# Patient Record
Sex: Male | Born: 1940 | Race: White | Hispanic: No | Marital: Married | Smoking: Former smoker
Health system: Southern US, Community
[De-identification: ages and names within clinical notes are randomized; demographics above are authoritative.]

## PROBLEM LIST (undated history)

## (undated) DIAGNOSIS — E785 Hyperlipidemia, unspecified: Secondary | ICD-10-CM

## (undated) DIAGNOSIS — I2510 Atherosclerotic heart disease of native coronary artery without angina pectoris: Secondary | ICD-10-CM

## (undated) DIAGNOSIS — N289 Disorder of kidney and ureter, unspecified: Secondary | ICD-10-CM

## (undated) DIAGNOSIS — M1990 Unspecified osteoarthritis, unspecified site: Secondary | ICD-10-CM

## (undated) DIAGNOSIS — I10 Essential (primary) hypertension: Secondary | ICD-10-CM

## (undated) DIAGNOSIS — D649 Anemia, unspecified: Secondary | ICD-10-CM

## (undated) DIAGNOSIS — G4730 Sleep apnea, unspecified: Secondary | ICD-10-CM

## (undated) DIAGNOSIS — K219 Gastro-esophageal reflux disease without esophagitis: Secondary | ICD-10-CM

## (undated) HISTORY — PX: OTHER SURGICAL HISTORY: SHX169

## (undated) HISTORY — PX: HEMORRHOID SURGERY: SHX153

---

## 2008-01-11 ENCOUNTER — Ambulatory Visit: Payer: Self-pay | Admitting: Internal Medicine

## 2010-04-23 HISTORY — PX: EYE SURGERY: SHX253

## 2010-06-01 HISTORY — PX: CARDIOVASCULAR STRESS TEST: SHX262

## 2011-03-08 ENCOUNTER — Emergency Department (HOSPITAL_COMMUNITY)
Admission: EM | Admit: 2011-03-08 | Discharge: 2011-03-09 | Disposition: A | Discharge status: Home / Self Care | Payer: Medicare Other | Attending: Emergency Medicine | Admitting: Emergency Medicine

## 2011-03-08 ENCOUNTER — Encounter (HOSPITAL_COMMUNITY): Payer: Self-pay | Admitting: *Deleted

## 2011-03-08 ENCOUNTER — Emergency Department (HOSPITAL_COMMUNITY): Payer: Medicare Other

## 2011-03-08 ENCOUNTER — Other Ambulatory Visit: Payer: Self-pay

## 2011-03-08 DIAGNOSIS — I2510 Atherosclerotic heart disease of native coronary artery without angina pectoris: Secondary | ICD-10-CM | POA: Insufficient documentation

## 2011-03-08 DIAGNOSIS — T781XXA Other adverse food reactions, not elsewhere classified, initial encounter: Secondary | ICD-10-CM | POA: Insufficient documentation

## 2011-03-08 DIAGNOSIS — R21 Rash and other nonspecific skin eruption: Secondary | ICD-10-CM | POA: Insufficient documentation

## 2011-03-08 DIAGNOSIS — T7840XA Allergy, unspecified, initial encounter: Secondary | ICD-10-CM

## 2011-03-08 DIAGNOSIS — R0789 Other chest pain: Secondary | ICD-10-CM | POA: Insufficient documentation

## 2011-03-08 DIAGNOSIS — L509 Urticaria, unspecified: Secondary | ICD-10-CM | POA: Insufficient documentation

## 2011-03-08 DIAGNOSIS — E119 Type 2 diabetes mellitus without complications: Secondary | ICD-10-CM | POA: Insufficient documentation

## 2011-03-08 DIAGNOSIS — L299 Pruritus, unspecified: Secondary | ICD-10-CM | POA: Insufficient documentation

## 2011-03-08 HISTORY — DX: Atherosclerotic heart disease of native coronary artery without angina pectoris: I25.10

## 2011-03-08 MED ORDER — DIPHENHYDRAMINE HCL 12.5 MG/5ML PO ELIX
12.5000 mg | ORAL_SOLUTION | Freq: Once | ORAL | Status: AC
  Administered 2011-03-08: 12.5 mg via ORAL
  Filled 2011-03-08: qty 5

## 2011-03-08 MED ORDER — FEXOFENADINE HCL 180 MG PO TABS: 180.0000 mg | ORAL_TABLET | Freq: Every day | ORAL | Status: DC

## 2011-03-08 MED ORDER — METHYLPREDNISOLONE SODIUM SUCC 125 MG IJ SOLR
125.0000 mg | Freq: Once | INTRAMUSCULAR | Status: AC
  Administered 2011-03-08: 125 mg via INTRAVENOUS
  Filled 2011-03-08: qty 2

## 2011-03-08 MED ORDER — DIPHENHYDRAMINE HCL 50 MG/ML IJ SOLN
12.5000 mg | Freq: Once | INTRAMUSCULAR | Status: AC
  Administered 2011-03-08: 12.5 mg via INTRAVENOUS
  Filled 2011-03-08: qty 1

## 2011-03-08 MED ORDER — FAMOTIDINE IN NACL 20-0.9 MG/50ML-% IV SOLN
20.0000 mg | Freq: Once | INTRAVENOUS | Status: AC
  Administered 2011-03-08: 20 mg via INTRAVENOUS
  Filled 2011-03-08: qty 50

## 2011-03-08 MED ORDER — PREDNISONE 10 MG PO TABS: ORAL_TABLET | ORAL | Status: DC

## 2011-03-08 NOTE — ED Notes (Signed)
Allergic reaction, rash on groin area and legs, ate shrimp tonight for dinner

## 2011-03-08 NOTE — ED Notes (Signed)
Pt ate shrimp at about 7pm and started itching at about 7:30pm. Rash started a few min after. Rash extends from neck down to ankles; worse area in groin region. No respiratory distress noted.

## 2011-03-08 NOTE — ED Provider Notes (Signed)
History     CSN: [id number]  Arrival date & time 03/08/11  2107   First MD Initiated Contact with Patient 03/08/11 2230      Chief Complaint  Patient presents with  . Allergic Reaction    (Consider location/radiation/quality/duration/timing/severity/associated sxs/prior treatment) HPI Comments: Patient states he and his wife had dinner this evening, he states that he had shrimp to eat. Approximately 5-10 minutes after finishing the medial, the patient began to have itching first at the lower abdomen, and then" all over". He went to the next room where his wife was, and she noted that he had" hives" are all away around his abdomen. And the spread to the arms chest back groin and legs. The patient developed some mild tightness in the chest. The patient has a history of coronary artery disease, As well as diabetes. Patient's wife then had the son to bring the patient to the emergency department for an evaluation. The patient denies shortness of breath. Denies sweats. Denies vomiting. The tightness in the chest resolved after the patient received IV antihistamines in the emergency department.  The patient has no known allergies, and in particular no known allergies to shellfish of any kind.  Patient is a 71 y.o. male presenting with allergic reaction. The history is provided by the patient and the spouse.  Allergic Reaction The primary symptoms are  rash. The primary symptoms do not include wheezing, shortness of breath, cough, abdominal pain, dizziness or palpitations.    Past Medical History  Diagnosis Date  . Coronary artery disease   . Diabetes mellitus     Past Surgical History  Procedure Date  . Hemorrhoid surgery   . Eye surgery     No family history on file.  History  Substance Use Topics  . Smoking status: Never Smoker   . Smokeless tobacco: Not on file  . Alcohol Use: Yes      Review of Systems  Constitutional: Negative for activity change.       All ROS Neg  except as noted in HPI  HENT: Negative for nosebleeds, facial swelling and neck pain.   Eyes: Negative for photophobia and discharge.  Respiratory: Positive for chest tightness. Negative for cough, shortness of breath and wheezing.   Cardiovascular: Negative for chest pain and palpitations.       Chest tightness  Gastrointestinal: Negative for abdominal pain and blood in stool.  Genitourinary: Negative for dysuria, frequency and hematuria.  Musculoskeletal: Negative for back pain and arthralgias.  Skin: Positive for rash.       Itching  Neurological: Negative for dizziness, seizures and speech difficulty.  Psychiatric/Behavioral: Negative for hallucinations and confusion.    Allergies  Tetracyclines & related  Home Medications  No current outpatient prescriptions on file.  BP 124/71  Pulse 82  Temp 97.4 F (36.3 C)  Resp 20  Ht 5\' 7"  (1.702 m)  Wt 215 lb (97.523 kg)  BMI 33.67 kg/m2  SpO2 98%  Physical Exam  Nursing note and vitals reviewed. Constitutional: He is oriented to person, place, and time. He appears well-developed and well-nourished.  Non-toxic appearance. He appears distressed.  HENT:  Head: Normocephalic.  Right Ear: Tympanic membrane and external ear normal.  Left Ear: Tympanic membrane and external ear normal.  Mouth/Throat: Oropharynx is clear and moist.  Eyes: EOM and lids are normal. Pupils are equal, round, and reactive to light.  Neck: Normal range of motion. Neck supple. Carotid bruit is not present.  Cardiovascular: Normal rate, regular  rhythm, normal heart sounds, intact distal pulses and normal pulses.  Exam reveals no gallop and no friction rub.   Pulmonary/Chest: Breath sounds normal. No stridor. No respiratory distress. He has no wheezes. He has no rales.  Abdominal: Soft. Bowel sounds are normal. There is no tenderness. There is no guarding.  Musculoskeletal: Normal range of motion.  Lymphadenopathy:       Head (right side): No submandibular  adenopathy present.       Head (left side): No submandibular adenopathy present.    He has no cervical adenopathy.  Neurological: He is alert and oriented to person, place, and time. He has normal strength. No cranial nerve deficit or sensory deficit.  Skin: Skin is warm and dry. Rash noted. There is erythema.       Hives of various stages noted of the chest, mostly of the lower abdomen and groin area, arms, back,.  Psychiatric: He has a normal mood and affect. His speech is normal.    ED Course  Procedures (including critical care time)  Labs Reviewed - No data to display No results found. EKG: 23:03. Rate 83. Rhythm normal sinus. Axis: Left axis deviation . P wave normal. P R. interval normal. Nonspecific T-wave changes. No STEMI. No life-threatening arrhythmias.  Dx: Allergic reaction   MDM  I have reviewed nursing notes, vital signs, and all appropriate lab and imaging results for this patient. 10:28 PM the hives are beginning to improve . The itching is much improved. The patient speaks in complete sentences without problem. There is no further chest pain or tightness. 11:30 PM the hives have almost completely resolved. There is no shortness of breath. The patient continues to speak in complete sentences without problem. There is no further chest tightness. The test results have been reviewed with the patient. The plan for the patient is to have prednisone tablets and Benadryl on hand at all times, and should he run across an allergen he is to use these and then come to the emergency department immediately. We will not issue a EpiPen due to the patient's history of coronary artery disease at this time  No results found for this or any previous visit. Dg Chest 1 View  03/08/2011  *RADIOLOGY REPORT*  Clinical Data: Allergic reaction  CHEST - 1 VIEW  Comparison: None.  Findings: Decreased lung volumes with minimal basilar atelectasis. Normal heart size and vascularity.  Negative for  edema, pneumonia, collapse, consolidation, effusion or pneumothorax.  Trachea midline.  Degenerative changes of the spine.  IMPRESSION: Low volume exam with atelectasis.  Original Report Authenticated By: Judie Petit. Ruel Favors, M.D.       Kathie Dike, [place] 03/08/11 2340

## 2011-03-09 NOTE — ED Provider Notes (Signed)
Medical screening examination/treatment/procedure(s) were conducted as a shared visit with non-physician practitioner(s) and myself.  I personally evaluated the patient during the encounter.  Allergic reaction improved with IV Solu-Medrol, Pepcid, Benadryl.  Patient stable at discharge  Donnetta Hutching, MD 03/09/11 0009

## 2011-04-23 HISTORY — PX: OTHER SURGICAL HISTORY: SHX169

## 2011-06-21 ENCOUNTER — Encounter (HOSPITAL_COMMUNITY): Payer: Self-pay | Admitting: Pharmacy Technician

## 2011-06-23 ENCOUNTER — Encounter (HOSPITAL_COMMUNITY)
Admission: RE | Admit: 2011-06-23 | Discharge: 2011-06-23 | Disposition: A | Discharge status: Home / Self Care | Payer: Medicare Other | Source: Ambulatory Visit | Attending: Orthopedic Surgery | Admitting: Orthopedic Surgery

## 2011-06-23 ENCOUNTER — Encounter (HOSPITAL_COMMUNITY): Payer: Self-pay

## 2011-06-23 HISTORY — DX: Anemia, unspecified: D64.9

## 2011-06-23 HISTORY — DX: Unspecified osteoarthritis, unspecified site: M19.90

## 2011-06-23 HISTORY — DX: Gastro-esophageal reflux disease without esophagitis: K21.9

## 2011-06-23 HISTORY — DX: Sleep apnea, unspecified: G47.30

## 2011-06-23 HISTORY — DX: Disorder of kidney and ureter, unspecified: N28.9

## 2011-06-23 HISTORY — DX: Essential (primary) hypertension: I10

## 2011-06-23 HISTORY — DX: Hyperlipidemia, unspecified: E78.5

## 2011-06-23 LAB — SURGICAL PCR SCREEN: MRSA, PCR: INVALID — AB

## 2011-06-23 LAB — CBC
MCHC: 34.6 g/dL (ref 30.0–36.0)
Platelets: 192 10*3/uL (ref 150–400)
RDW: 12.6 % (ref 11.5–15.5)
WBC: 8.2 10*3/uL (ref 4.0–10.5)

## 2011-06-23 LAB — URINALYSIS, ROUTINE W REFLEX MICROSCOPIC
Glucose, UA: NEGATIVE mg/dL
Ketones, ur: NEGATIVE mg/dL
Leukocytes, UA: NEGATIVE
Protein, ur: NEGATIVE mg/dL
Urobilinogen, UA: 0.2 mg/dL (ref 0.0–1.0)

## 2011-06-23 LAB — BASIC METABOLIC PANEL
BUN: 29 mg/dL — ABNORMAL HIGH (ref 6–23)
Calcium: 10.2 mg/dL (ref 8.4–10.5)
Chloride: 97 mEq/L (ref 96–112)
Creatinine, Ser: 1.99 mg/dL — ABNORMAL HIGH (ref 0.50–1.35)
GFR calc Af Amer: 38 mL/min — ABNORMAL LOW (ref >90)
GFR calc non Af Amer: 33 mL/min — ABNORMAL LOW (ref >90)

## 2011-06-23 LAB — DIFFERENTIAL
Basophils Absolute: 0 10*3/uL (ref 0.0–0.1)
Basophils Relative: 1 % (ref 0–1)
Lymphocytes Relative: 24 % (ref 12–46)
Monocytes Absolute: 1.1 10*3/uL — ABNORMAL HIGH (ref 0.1–1.0)
Neutro Abs: 4.7 10*3/uL (ref 1.7–7.7)
Neutrophils Relative %: 57 % (ref 43–77)

## 2011-06-23 LAB — PROTIME-INR: Prothrombin Time: 13.1 seconds (ref 11.6–15.2)

## 2011-06-23 LAB — APTT: aPTT: 32 seconds (ref 24–37)

## 2011-06-23 MED ORDER — CEFAZOLIN SODIUM 1-5 GM-% IV SOLN: 1.0000 g | INTRAVENOUS | Status: DC

## 2011-06-23 NOTE — Pre-Procedure Instructions (Signed)
Called creatinine 1.99 and bun 29 result to courtney sykes for dr Charlann Boxer

## 2011-06-23 NOTE — Pre-Procedure Instructions (Addendum)
ekg 03-08-2011 epic Chest 1 view xray 03-08-2011 epic Medical clearance note dr Burnadette Pop on chart

## 2011-06-23 NOTE — Patient Instructions (Addendum)
William Harrington  06/23/2011   Your procedure is scheduled on:  Thursday Jul 01, 2011  Report to [facility] at 0530  AM.  Call this number if you have problems the morning of surgery: [phone]   Remember:   Do not eat food or drink liquids:After Midnight.  .  Take these medicines the morning of surgery with A SIP OF WATER: metoprolol, omeprazole   Do not wear jewelry or make up.  Do not wear lotions, powders, or perfumes.Do not wear deodorant.    Do not bring valuables to the hospital.  Contacts, dentures or bridgework may not be worn into surgery.  Leave suitcase in the car. After surgery it may be brought to your room.  For patients admitted to the hospital, checkout time is 11:00 AM the day of discharge.     Special Instructions: CHG Shower Use Special Wash: 1/2 bottle night before surgery and 1/2 bottle morning of surgery use regular soap on face and front and back private areas.   Please read over the following fact sheets that you were given: MRSA Information, blood fact sheet, incentive spirometer fact sheet  William Harrington [facility] pre op nurse phone number [phone], call if needed

## 2011-06-23 NOTE — Progress Notes (Signed)
06/23/11 1317  OBSTRUCTIVE SLEEP APNEA  Have you ever been diagnosed with sleep apnea through a sleep study? No  Do you snore loudly (loud enough to be heard through closed doors)?  1  Do you often feel tired, fatigued, or sleepy during the daytime? 0  Has anyone observed you stop breathing during your sleep? 0  Do you have, or are you being treated for high blood pressure? 1  BMI more than 35 kg/m2? 1  Age over 70 years old? 1  Neck circumference greater than 40 cm/18 inches? 0  Gender: 1  Obstructive Sleep Apnea Score 5   Score 4 or greater  Results sent to PCP;Updated health history

## 2011-06-26 LAB — MRSA CULTURE

## 2011-06-27 NOTE — H&P (Signed)
William Harrington is an 71 y.o. male.    Chief Complaint: left hip OA and pain   HPI: Pt is a 71 y.o. male complaining of left hip pain for 2+ years. Pain had continually increased since the beginning, denies any incident or trauma. X-rays in the clinic show end-stage arthritic changes of the left hip. Pt has tried various conservative treatments which have failed to alleviate their symptoms. Various options are discussed with the patient. Risks, benefits and expectations were discussed with the patient. Patient understand the risks, benefits and expectations and wishes to proceed with surgery.   PCP:  No primary provider on file.  D/C Plans:  Home with HHPT  Post-op Meds:  Rx given for ASA, Robaxin, Iron, Colace and MiraLax  Tranexamic Acid:   Not to be given - Vascular disease  Decadron:   Not to be given - Diabetes  PMH: Past Medical History  Diagnosis Date  . Coronary artery disease   . Diabetes mellitus   . Hypertension   . Hyperlipidemia   . Anemia yrs ago  . GERD (gastroesophageal reflux disease)   . Arthritis   . Renal insufficiency     creatinine 1.5 baseline   . Sleep apnea     STOPBANG=5    PSH: Past Surgical History  Procedure Date  . Hemorrhoid surgery yrs ago  . Cardiovascular stress test 06-01-2010    normal nuclear stress test per dr Lady Gary. last office visit note dr Purcell Mouton 02-01-2011 on chart  . Eye surgery 04-2010    macular hole surgery, right eye  . Cataract extraction right eye 04-2011  . Colonscopy 5 yrs ago    Social History:  reports that he quit smoking about 16 years ago. His smoking use included Cigarettes. He has a 82 pack-year smoking history. He has never used smokeless tobacco. He reports that he drinks alcohol. He reports that he does not use illicit drugs.  Allergies:  Allergies  Allergen Reactions  . Tetracyclines & Related Nausea And Vomiting  . Shrimp (Shellfish Allergy) Rash    wheezing    Medications: No current  facility-administered medications for this encounter.   Current Outpatient Prescriptions  Medication Sig Dispense Refill  . acetaminophen (TYLENOL EX ST ARTHRITIS PAIN) 500 MG tablet Take 500 mg by mouth every 6 (six) hours as needed. For pain      . aspirin EC 81 MG tablet Take 81 mg by mouth daily with breakfast.       . DICLOFENAC POTASSIUM PO Take 1 tablet by mouth 2 (two) times daily. Patient takes 75mg  twice a day      . fish oil-omega-3 fatty acids 1000 MG capsule Take 1 g by mouth 2 (two) times daily.       Marland Kitchen glipiZIDE (GLUCOTROL) 5 MG tablet Take 2.5 mg by mouth 2 (two) times daily before a meal.      . hydrochlorothiazide (HYDRODIURIL) 25 MG tablet Take 25 mg by mouth daily with breakfast.       . lisinopril (PRINIVIL,ZESTRIL) 40 MG tablet Take 40 mg by mouth daily with breakfast.      . metoprolol (LOPRESSOR) 100 MG tablet Take 100 mg by mouth daily with breakfast.      . omeprazole (PRILOSEC) 20 MG capsule Take 20 mg by mouth daily with breakfast.       . simvastatin (ZOCOR) 80 MG tablet Take 40 mg by mouth at bedtime. Patient takes 1/2 tablet by mouth at bedtime (40mg )      .  vitamin E (VITAMIN E) 400 UNIT capsule Take 400 Units by mouth daily.        ROS: Review of Systems  Constitutional: Negative.   HENT: Negative.   Eyes: Negative.   Respiratory: Negative.   Cardiovascular: Negative.   Gastrointestinal: Negative.   Genitourinary: Negative.   Musculoskeletal: Positive for joint pain.  Skin: Negative.   Neurological: Negative.   Endo/Heme/Allergies: Negative.   Psychiatric/Behavioral: Negative.      Physical Exam: Physical Exam  Constitutional: He is oriented to person, place, and time and well-developed, well-nourished, and in no distress.  HENT:  Head: Normocephalic and atraumatic.  Nose: Nose normal.  Mouth/Throat: Oropharynx is clear and moist. He has dentures.  Eyes: Pupils are equal, round, and reactive to light.  Neck: Neck supple. No JVD present. No  tracheal deviation present. No thyromegaly present.  Cardiovascular: Normal rate, regular rhythm, normal heart sounds and intact distal pulses.   Pulmonary/Chest: Effort normal and breath sounds normal. No stridor.  Abdominal: Soft. There is no tenderness. There is no guarding.  Musculoskeletal:       Left hip: He exhibits decreased range of motion, decreased strength, tenderness, bony tenderness and crepitus. He exhibits no swelling, no deformity and no laceration.  Lymphadenopathy:    He has no cervical adenopathy.  Neurological: He is alert and oriented to person, place, and time.  Skin: Skin is warm and dry.  Psychiatric: Affect normal.     Assessment/Plan Assessment: left hip OA and pain   Plan: Patient will undergo a left total hip arthroplasty, anterior approach on 07/01/2011 per Dr. Charlann Boxer at [facility]. Risks benefits and expectation were discussed with the patient. Patient understand risks, benefits and expectation and wishes to proceed.   William Harrington   PAC  06/27/2011, 10:49 PM

## 2011-06-30 ENCOUNTER — Encounter (HOSPITAL_COMMUNITY): Payer: Self-pay | Admitting: Anesthesiology

## 2011-06-30 NOTE — Anesthesia Preprocedure Evaluation (Addendum)
Anesthesia Evaluation  Patient identified by MRN, date of birth, ID band Patient awake    Reviewed: Allergy & Precautions, H&P , NPO status , Patient's Chart, lab work & pertinent test results  Airway       Dental   Pulmonary sleep apnea , former smoker CXR: 03/08/11: Low volume exam with atelectasis.         Cardiovascular Exercise Tolerance: Good hypertension, Pt. on medications and Pt. on home beta blockers + CAD  ECG: NSR, LAD, possible anterior infarct. Normal stress test in 2012 per Dr. Lady Gary.   Neuro/Psych negative neurological ROS  negative psych ROS   GI/Hepatic Neg liver ROS, GERD-  Medicated,  Endo/Other  negative endocrine ROSDiabetes mellitus-, Type 2, Oral Hypoglycemic Agents  Renal/GU Renal InsufficiencyRenal disease  negative genitourinary   Musculoskeletal negative musculoskeletal ROS (+)   Abdominal (+) + obese,   Peds negative pediatric ROS (+)  Hematology negative hematology ROS (+)   Anesthesia Other Findings   Reproductive/Obstetrics negative OB ROS                        Anesthesia Physical Anesthesia Plan  ASA: III  Anesthesia Plan: General   Post-op Pain Management:    Induction: Intravenous  Airway Management Planned: Oral ETT  Additional Equipment:   Intra-op Plan:   Post-operative Plan: Extubation in OR  Informed Consent: I have reviewed the patients History and Physical, chart, labs and discussed the procedure including the risks, benefits and alternatives for the proposed anesthesia with the patient or authorized representative who has indicated his/her understanding and acceptance.   Dental advisory given  Plan Discussed with: CRNA  Anesthesia Plan Comments:         Anesthesia Quick Evaluation

## 2011-07-01 ENCOUNTER — Ambulatory Visit (HOSPITAL_COMMUNITY): Payer: Medicare Other

## 2011-07-01 ENCOUNTER — Encounter (HOSPITAL_COMMUNITY): Payer: Self-pay | Admitting: Anesthesiology

## 2011-07-01 ENCOUNTER — Inpatient Hospital Stay (HOSPITAL_COMMUNITY)
Admission: RE | Admit: 2011-07-01 | Discharge: 2011-07-03 | DRG: 470 | Disposition: A | Discharge status: Home Health | Payer: Medicare Other | Source: Ambulatory Visit | Attending: Orthopedic Surgery | Admitting: Orthopedic Surgery

## 2011-07-01 ENCOUNTER — Encounter (HOSPITAL_COMMUNITY): Payer: Self-pay

## 2011-07-01 ENCOUNTER — Encounter (HOSPITAL_COMMUNITY)
Admission: RE | Disposition: A | Discharge status: Home Health | Payer: Self-pay | Source: Ambulatory Visit | Attending: Orthopedic Surgery

## 2011-07-01 ENCOUNTER — Ambulatory Visit (HOSPITAL_COMMUNITY): Payer: Medicare Other | Admitting: Anesthesiology

## 2011-07-01 DIAGNOSIS — Z96649 Presence of unspecified artificial hip joint: Secondary | ICD-10-CM

## 2011-07-01 DIAGNOSIS — K219 Gastro-esophageal reflux disease without esophagitis: Secondary | ICD-10-CM | POA: Diagnosis present

## 2011-07-01 DIAGNOSIS — Z01812 Encounter for preprocedural laboratory examination: Secondary | ICD-10-CM

## 2011-07-01 DIAGNOSIS — E119 Type 2 diabetes mellitus without complications: Secondary | ICD-10-CM | POA: Diagnosis present

## 2011-07-01 DIAGNOSIS — I10 Essential (primary) hypertension: Secondary | ICD-10-CM | POA: Diagnosis present

## 2011-07-01 DIAGNOSIS — D649 Anemia, unspecified: Secondary | ICD-10-CM | POA: Diagnosis not present

## 2011-07-01 DIAGNOSIS — E785 Hyperlipidemia, unspecified: Secondary | ICD-10-CM | POA: Diagnosis present

## 2011-07-01 DIAGNOSIS — G4730 Sleep apnea, unspecified: Secondary | ICD-10-CM | POA: Diagnosis present

## 2011-07-01 DIAGNOSIS — N289 Disorder of kidney and ureter, unspecified: Secondary | ICD-10-CM | POA: Diagnosis present

## 2011-07-01 DIAGNOSIS — M1610 Unilateral primary osteoarthritis, unspecified hip: Principal | ICD-10-CM | POA: Diagnosis present

## 2011-07-01 DIAGNOSIS — M169 Osteoarthritis of hip, unspecified: Principal | ICD-10-CM | POA: Diagnosis present

## 2011-07-01 HISTORY — PX: TOTAL HIP ARTHROPLASTY: SHX124

## 2011-07-01 LAB — GLUCOSE, CAPILLARY
Glucose-Capillary: 209 mg/dL — ABNORMAL HIGH (ref 70–99)
Glucose-Capillary: 172 mg/dL — ABNORMAL HIGH (ref 70–99)
Glucose-Capillary: 204 mg/dL — ABNORMAL HIGH (ref 70–99)

## 2011-07-01 LAB — TYPE AND SCREEN
ABO/RH(D): O POS
Antibody Screen: NEGATIVE

## 2011-07-01 SURGERY — ARTHROPLASTY, HIP, TOTAL, ANTERIOR APPROACH
Anesthesia: General | Site: Hip | Laterality: Left | Wound class: Clean

## 2011-07-01 MED ORDER — BISACODYL 5 MG PO TBEC: 5.0000 mg | DELAYED_RELEASE_TABLET | Freq: Every day | ORAL | Status: DC | PRN

## 2011-07-01 MED ORDER — CEFAZOLIN SODIUM-DEXTROSE 2-3 GM-% IV SOLR
2.0000 g | Freq: Four times a day (QID) | INTRAVENOUS | Status: AC
Start: 2011-07-01 — End: 2011-07-02
  Administered 2011-07-01 – 2011-07-02 (×3): 2 g via INTRAVENOUS
  Filled 2011-07-01 (×4): qty 50

## 2011-07-01 MED ORDER — LACTATED RINGERS IV SOLN: INTRAVENOUS | Status: DC

## 2011-07-01 MED ORDER — POLYETHYLENE GLYCOL 3350 17 G PO PACK
17.0000 g | PACK | Freq: Two times a day (BID) | ORAL | Status: DC
  Administered 2011-07-01 – 2011-07-03 (×4): 17 g via ORAL

## 2011-07-01 MED ORDER — METOPROLOL TARTRATE 100 MG PO TABS
100.0000 mg | ORAL_TABLET | Freq: Every day | ORAL | Status: DC
  Administered 2011-07-02 – 2011-07-03 (×2): 100 mg via ORAL
  Filled 2011-07-01 (×3): qty 1

## 2011-07-01 MED ORDER — SUCCINYLCHOLINE CHLORIDE 20 MG/ML IJ SOLN
INTRAMUSCULAR | Status: DC | PRN
  Administered 2011-07-01: 100 mg via INTRAVENOUS

## 2011-07-01 MED ORDER — ONDANSETRON HCL 4 MG/2ML IJ SOLN
INTRAMUSCULAR | Status: DC | PRN
  Administered 2011-07-01: 4 mg via INTRAVENOUS

## 2011-07-01 MED ORDER — CEFAZOLIN SODIUM-DEXTROSE 2-3 GM-% IV SOLR
2.0000 g | Freq: Once | INTRAVENOUS | Status: AC
  Administered 2011-07-01: 2 g via INTRAVENOUS

## 2011-07-01 MED ORDER — ALUM & MAG HYDROXIDE-SIMETH 200-200-20 MG/5ML PO SUSP: 30.0000 mL | ORAL | Status: DC | PRN

## 2011-07-01 MED ORDER — SODIUM CHLORIDE 0.9 % IV SOLN
100.0000 mL/h | INTRAVENOUS | Status: DC
  Administered 2011-07-01: 100 mL/h via INTRAVENOUS
  Filled 2011-07-01 (×5): qty 1000

## 2011-07-01 MED ORDER — INSULIN ASPART 100 UNIT/ML ~~LOC~~ SOLN
0.0000 [IU] | Freq: Three times a day (TID) | SUBCUTANEOUS | Status: DC
  Administered 2011-07-01: 3 [IU] via SUBCUTANEOUS
  Administered 2011-07-01 – 2011-07-02 (×3): 5 [IU] via SUBCUTANEOUS
  Administered 2011-07-02 – 2011-07-03 (×2): 3 [IU] via SUBCUTANEOUS

## 2011-07-01 MED ORDER — INSULIN ASPART 100 UNIT/ML ~~LOC~~ SOLN: 0.0000 [IU] | Freq: Three times a day (TID) | SUBCUTANEOUS | Status: DC

## 2011-07-01 MED ORDER — PANTOPRAZOLE SODIUM 40 MG PO TBEC
40.0000 mg | DELAYED_RELEASE_TABLET | Freq: Every day | ORAL | Status: DC
  Administered 2011-07-01 – 2011-07-02 (×2): 40 mg via ORAL
  Filled 2011-07-01 (×3): qty 1

## 2011-07-01 MED ORDER — METOCLOPRAMIDE HCL 10 MG PO TABS: 5.0000 mg | ORAL_TABLET | Freq: Three times a day (TID) | ORAL | Status: DC | PRN

## 2011-07-01 MED ORDER — FERROUS SULFATE 325 (65 FE) MG PO TABS
325.0000 mg | ORAL_TABLET | Freq: Three times a day (TID) | ORAL | Status: DC
  Administered 2011-07-01 – 2011-07-03 (×6): 325 mg via ORAL
  Filled 2011-07-01 (×9): qty 1

## 2011-07-01 MED ORDER — NEOSTIGMINE METHYLSULFATE 1 MG/ML IJ SOLN
INTRAMUSCULAR | Status: DC | PRN
  Administered 2011-07-01: 4 mg via INTRAVENOUS

## 2011-07-01 MED ORDER — GLYCOPYRROLATE 0.2 MG/ML IJ SOLN
INTRAMUSCULAR | Status: DC | PRN
  Administered 2011-07-01: .5 mg via INTRAVENOUS
  Administered 2011-07-01: .2 mg via INTRAVENOUS

## 2011-07-01 MED ORDER — HYDROCODONE-ACETAMINOPHEN 7.5-325 MG PO TABS
1.0000 | ORAL_TABLET | ORAL | Status: DC
  Administered 2011-07-01 (×2): 2 via ORAL
  Administered 2011-07-01: 1 via ORAL
  Administered 2011-07-02 – 2011-07-03 (×7): 2 via ORAL
  Administered 2011-07-03 (×2): 1 via ORAL
  Administered 2011-07-03: 2 via ORAL
  Filled 2011-07-01 (×2): qty 1
  Filled 2011-07-01 (×11): qty 2

## 2011-07-01 MED ORDER — METHOCARBAMOL 100 MG/ML IJ SOLN
500.0000 mg | Freq: Four times a day (QID) | INTRAVENOUS | Status: DC | PRN
  Administered 2011-07-01: 500 mg via INTRAVENOUS
  Filled 2011-07-01 (×2): qty 5

## 2011-07-01 MED ORDER — CEFAZOLIN SODIUM-DEXTROSE 2-3 GM-% IV SOLR
INTRAVENOUS | Status: AC
  Filled 2011-07-01: qty 50

## 2011-07-01 MED ORDER — DOCUSATE SODIUM 100 MG PO CAPS
100.0000 mg | ORAL_CAPSULE | Freq: Two times a day (BID) | ORAL | Status: DC
  Administered 2011-07-01 – 2011-07-03 (×5): 100 mg via ORAL

## 2011-07-01 MED ORDER — HYDROMORPHONE HCL PF 1 MG/ML IJ SOLN
INTRAMUSCULAR | Status: AC
  Filled 2011-07-01: qty 1

## 2011-07-01 MED ORDER — GLIPIZIDE 2.5 MG HALF TABLET
2.5000 mg | ORAL_TABLET | Freq: Two times a day (BID) | ORAL | Status: DC
  Administered 2011-07-01 – 2011-07-03 (×4): 2.5 mg via ORAL
  Filled 2011-07-01 (×7): qty 1

## 2011-07-01 MED ORDER — MENTHOL 3 MG MT LOZG
1.0000 | LOZENGE | OROMUCOSAL | Status: DC | PRN
  Filled 2011-07-01: qty 9

## 2011-07-01 MED ORDER — LACTATED RINGERS IV SOLN
INTRAVENOUS | Status: DC | PRN
  Administered 2011-07-01 (×2): via INTRAVENOUS

## 2011-07-01 MED ORDER — ACETAMINOPHEN 10 MG/ML IV SOLN
INTRAVENOUS | Status: DC | PRN
  Administered 2011-07-01: 1000 mg via INTRAVENOUS

## 2011-07-01 MED ORDER — ZOLPIDEM TARTRATE 5 MG PO TABS: 5.0000 mg | ORAL_TABLET | Freq: Every evening | ORAL | Status: DC | PRN

## 2011-07-01 MED ORDER — METOCLOPRAMIDE HCL 5 MG/ML IJ SOLN: 5.0000 mg | Freq: Three times a day (TID) | INTRAMUSCULAR | Status: DC | PRN

## 2011-07-01 MED ORDER — 0.9 % SODIUM CHLORIDE (POUR BTL) OPTIME
TOPICAL | Status: DC | PRN
  Administered 2011-07-01: 1000 mL

## 2011-07-01 MED ORDER — ONDANSETRON HCL 4 MG PO TABS: 4.0000 mg | ORAL_TABLET | Freq: Four times a day (QID) | ORAL | Status: DC | PRN

## 2011-07-01 MED ORDER — DIPHENHYDRAMINE HCL 25 MG PO CAPS: 25.0000 mg | ORAL_CAPSULE | Freq: Four times a day (QID) | ORAL | Status: DC | PRN

## 2011-07-01 MED ORDER — HYDROMORPHONE HCL PF 1 MG/ML IJ SOLN
0.2500 mg | INTRAMUSCULAR | Status: DC | PRN
  Administered 2011-07-01: 0.5 mg via INTRAVENOUS
  Administered 2011-07-01 (×2): 0.25 mg via INTRAVENOUS
  Administered 2011-07-01: 0.5 mg via INTRAVENOUS

## 2011-07-01 MED ORDER — RIVAROXABAN 10 MG PO TABS
10.0000 mg | ORAL_TABLET | ORAL | Status: DC
  Administered 2011-07-02 – 2011-07-03 (×2): 10 mg via ORAL
  Filled 2011-07-01 (×3): qty 1

## 2011-07-01 MED ORDER — CISATRACURIUM BESYLATE (PF) 10 MG/5ML IV SOLN
INTRAVENOUS | Status: DC | PRN
  Administered 2011-07-01: 4 mg via INTRAVENOUS

## 2011-07-01 MED ORDER — ATORVASTATIN CALCIUM 20 MG PO TABS
20.0000 mg | ORAL_TABLET | Freq: Every day | ORAL | Status: DC
  Administered 2011-07-01 – 2011-07-02 (×2): 20 mg via ORAL
  Filled 2011-07-01 (×3): qty 1

## 2011-07-01 MED ORDER — PROMETHAZINE HCL 25 MG/ML IJ SOLN: 6.2500 mg | INTRAMUSCULAR | Status: DC | PRN

## 2011-07-01 MED ORDER — MIDAZOLAM HCL 5 MG/5ML IJ SOLN
INTRAMUSCULAR | Status: DC | PRN
  Administered 2011-07-01: 2 mg via INTRAVENOUS

## 2011-07-01 MED ORDER — FENTANYL CITRATE 0.05 MG/ML IJ SOLN
INTRAMUSCULAR | Status: DC | PRN
  Administered 2011-07-01 (×3): 50 ug via INTRAVENOUS
  Administered 2011-07-01: 100 ug via INTRAVENOUS
  Administered 2011-07-01 (×5): 50 ug via INTRAVENOUS

## 2011-07-01 MED ORDER — METHOCARBAMOL 500 MG PO TABS
500.0000 mg | ORAL_TABLET | Freq: Four times a day (QID) | ORAL | Status: DC | PRN
  Administered 2011-07-01 – 2011-07-02 (×2): 500 mg via ORAL
  Filled 2011-07-01 (×2): qty 1

## 2011-07-01 MED ORDER — ONDANSETRON HCL 4 MG/2ML IJ SOLN: 4.0000 mg | Freq: Four times a day (QID) | INTRAMUSCULAR | Status: DC | PRN

## 2011-07-01 MED ORDER — ACETAMINOPHEN 10 MG/ML IV SOLN
INTRAVENOUS | Status: AC
  Filled 2011-07-01: qty 100

## 2011-07-01 MED ORDER — FLEET ENEMA 7-19 GM/118ML RE ENEM: 1.0000 | ENEMA | Freq: Once | RECTAL | Status: AC | PRN

## 2011-07-01 MED ORDER — HYDROMORPHONE HCL PF 1 MG/ML IJ SOLN: 0.5000 mg | INTRAMUSCULAR | Status: DC | PRN

## 2011-07-01 MED ORDER — PROPOFOL 10 MG/ML IV BOLUS
INTRAVENOUS | Status: DC | PRN
  Administered 2011-07-01: 140 mg via INTRAVENOUS

## 2011-07-01 MED ORDER — HYDROCHLOROTHIAZIDE 25 MG PO TABS
25.0000 mg | ORAL_TABLET | Freq: Every day | ORAL | Status: DC
  Administered 2011-07-02 – 2011-07-03 (×2): 25 mg via ORAL
  Filled 2011-07-01 (×3): qty 1

## 2011-07-01 MED ORDER — PHENOL 1.4 % MT LIQD
1.0000 | OROMUCOSAL | Status: DC | PRN
  Filled 2011-07-01: qty 177

## 2011-07-01 SURGICAL SUPPLY — 40 items
BAG ZIPLOCK 12X15 (MISCELLANEOUS) ×4 IMPLANT
BLADE SAW SGTL 18X1.27X75 (BLADE) ×2 IMPLANT
CELLS DAT CNTRL 66122 CELL SVR (MISCELLANEOUS) IMPLANT
CLOTH BEACON ORANGE TIMEOUT ST (SAFETY) ×2 IMPLANT
DERMABOND ADVANCED (GAUZE/BANDAGES/DRESSINGS) ×1
DERMABOND ADVANCED .7 DNX12 (GAUZE/BANDAGES/DRESSINGS) ×1 IMPLANT
DRAPE C-ARM 42X72 X-RAY (DRAPES) ×2 IMPLANT
DRAPE STERI IOBAN 125X83 (DRAPES) ×2 IMPLANT
DRAPE U-SHAPE 47X51 STRL (DRAPES) ×6 IMPLANT
DRSG AQUACEL AG ADV 3.5X10 (GAUZE/BANDAGES/DRESSINGS) ×2 IMPLANT
DRSG TEGADERM 4X4.75 (GAUZE/BANDAGES/DRESSINGS) ×2 IMPLANT
DURAPREP 26ML APPLICATOR (WOUND CARE) ×2 IMPLANT
ELECT BLADE TIP CTD 4 INCH (ELECTRODE) ×2 IMPLANT
ELECT REM PT RETURN 9FT ADLT (ELECTROSURGICAL) ×2
ELECTRODE REM PT RTRN 9FT ADLT (ELECTROSURGICAL) ×1 IMPLANT
EVACUATOR 1/8 PVC DRAIN (DRAIN) ×2 IMPLANT
FACESHIELD LNG OPTICON STERILE (SAFETY) ×8 IMPLANT
GAUZE SPONGE 2X2 8PLY STRL LF (GAUZE/BANDAGES/DRESSINGS) ×1 IMPLANT
GLOVE BIOGEL PI IND STRL 7.5 (GLOVE) ×1 IMPLANT
GLOVE BIOGEL PI IND STRL 8 (GLOVE) ×1 IMPLANT
GLOVE BIOGEL PI INDICATOR 7.5 (GLOVE) ×1
GLOVE BIOGEL PI INDICATOR 8 (GLOVE) ×1
GLOVE ECLIPSE 8.0 STRL XLNG CF (GLOVE) ×2 IMPLANT
GLOVE ORTHO TXT STRL SZ7.5 (GLOVE) ×4 IMPLANT
GOWN BRE IMP PREV XXLGXLNG (GOWN DISPOSABLE) ×4 IMPLANT
GOWN STRL NON-REIN LRG LVL3 (GOWN DISPOSABLE) ×2 IMPLANT
KIT BASIN OR (CUSTOM PROCEDURE TRAY) ×2 IMPLANT
PACK TOTAL JOINT (CUSTOM PROCEDURE TRAY) ×2 IMPLANT
PADDING CAST COTTON 6X4 STRL (CAST SUPPLIES) ×2 IMPLANT
RTRCTR WOUND ALEXIS 18CM MED (MISCELLANEOUS)
SPONGE GAUZE 2X2 STER 10/PKG (GAUZE/BANDAGES/DRESSINGS) ×1
SUCTION FRAZIER 12FR DISP (SUCTIONS) ×2 IMPLANT
SUT DVC VLOC 180 2-0 12IN GS21 (SUTURE) ×2
SUT MNCRL AB 4-0 PS2 18 (SUTURE) ×2 IMPLANT
SUT VIC AB 1 CT1 36 (SUTURE) ×6 IMPLANT
SUT VIC AB 2-0 CT1 27 (SUTURE) ×2
SUT VIC AB 2-0 CT1 TAPERPNT 27 (SUTURE) ×2 IMPLANT
SUTURE DVC VL 180 2-0 12INGS21 (SUTURE) ×1 IMPLANT
TOWEL OR 17X26 10 PK STRL BLUE (TOWEL DISPOSABLE) ×4 IMPLANT
TRAY FOLEY CATH 14FRSI W/METER (CATHETERS) ×2 IMPLANT

## 2011-07-01 NOTE — Progress Notes (Signed)
CARE MANAGEMENT NOTE 07/01/2011  Patient:  William Harrington, William Harrington   Account Number:  [id number]  Date Initiated:  07/01/2011  Documentation initiated by:  Colleen Can  Subjective/Objective Assessment:   dx left hip osteoarthritis; total hip replacemnt -anterior approach     Action/Plan:   pt/ot pending   Anticipated DC Date:  07/03/2011   Anticipated DC Plan:        DC Planning Services  CM consult      Status of service:  In process, will continue to follow  Per UR Regulation:  Reviewed for med. necessity/level of care/duration of stay

## 2011-07-01 NOTE — Plan of Care (Signed)
Problem: Consults Goal: Diagnosis- Total Joint Replacement Primary Total Hip     

## 2011-07-01 NOTE — Transfer of Care (Signed)
Immediate Anesthesia Transfer of Care Note  Patient: William Harrington  Procedure(s) Performed: Procedure(s) (LRB): TOTAL HIP ARTHROPLASTY ANTERIOR APPROACH (Left)  Patient Location: PACU  Anesthesia Type: General  Level of Consciousness: awake, alert  and patient cooperative  Airway & Oxygen Therapy: Patient Spontanous Breathing and Patient connected to face mask oxygen  Post-op Assessment: Report given to PACU RN and Post -op Vital signs reviewed and stable  Post vital signs: Reviewed and stable  Complications: No apparent anesthesia complications

## 2011-07-01 NOTE — Op Note (Signed)
NAME:  William Harrington                ACCOUNT NO.: [id number]      MEDICAL RECORD NO.: [id number]      FACILITY:  [facility]      PHYSICIAN:  Durene Romans D  DATE OF BIRTH:  07/27/1940     DATE OF PROCEDURE:  07/01/2011                                 OPERATIVE REPORT         PREOPERATIVE DIAGNOSIS: Left  hip osteoarthritis.      POSTOPERATIVE DIAGNOSIS:  Left hip osteoarthritis.      PROCEDURE:  Left total hip replacement through an anterior approach   utilizing DePuy THR system, component size 54mm pinnacle cup, a size 36+4 neutral   Altrex liner, a size 3Hi Tri Lock stem with a 36+1.5 delta ceramic   ball.      SURGEON:  Madlyn Frankel. Charlann Boxer, M.D.      ASSISTANT:  Lanney Gins, PA      ANESTHESIA:  General.      SPECIMENS:  None.      COMPLICATIONS:  None.      BLOOD LOSS:  350 cc     DRAINS:  One Hemovac.      INDICATION OF THE PROCEDURE:  William Harrington is a 71 y.o. male who had   presented to office for evaluation of left hip pain.  Radiographs revealed   progressive degenerative changes with bone-on-bone   articulation to the  hip joint.  The patient had painful limited range of   motion significantly affecting their overall quality of life.  The patient was failing to    respond to conservative measures, and at this point was ready   to proceed with more definitive measures.  The patient has noted progressive   degenerative changes in his hip, progressive problems and dysfunction   with regarding the hip prior to surgery.  Consent was obtained for   benefit of pain relief.  Specific risk of infection, DVT, component   failure, dislocation, need for revision surgery, as well discussion of   the anterior versus posterior approach were reviewed.  Consent was   obtained for benefit of anterior pain relief through an anterior   approach.      PROCEDURE IN DETAIL:  The patient was brought to operative theater.   Once adequate anesthesia, preoperative antibiotics, 2gm Ancef  administered.   The patient was positioned supine on the OSI Hanna table.  Once adequate   padding of boney process was carried out, we had predraped out the hip, and  used fluoroscopy to confirm orientation of the pelvis and position.      The left hip was then prepped and draped from proximal iliac crest to   mid thigh with shower curtain technique.      Time-out was performed identifying the patient, planned procedure, and   extremity.     An incision was then made 2 cm distal and lateral to the   anterior superior iliac spine extending over the orientation of the   tensor fascia lata muscle and sharp dissection was carried down to the   fascia of the muscle and protractor placed in the soft tissues.      The fascia was then incised.  The muscle belly was identified and swept   laterally  and retractor placed along the superior neck.  Following   cauterization of the circumflex vessels and removing some pericapsular   fat, a second cobra retractor was placed on the inferior neck.  A third   retractor was placed on the anterior acetabulum after elevating the   anterior rectus.  A L-capsulotomy was along the line of the   superior neck to the trochanteric fossa, then extended proximally and   distally.  Tag sutures were placed and the retractors were then placed   intracapsular.  We then identified the trochanteric fossa and   orientation of my neck cut, confirmed this radiographically   and then made a neck osteotomy with the femur on traction.  The femoral   head was removed without difficulty or complication.  Traction was let   off and retractors were placed posterior and anterior around the   acetabulum.      The labrum and foveal tissue were debrided.  I began reaming with a 48mm   reamer and reamed up to 53mm reamer with good bony bed preparation and a 54   cup was chosen.  The final 54mm Pinnacle cup was then impacted under fluoroscopy  to confirm the depth of penetration and  orientation with respect to   abduction.  A screw was placed followed by the hole eliminator.  The final   36+4 neutral Altrex liner was impacted with good visualized rim fit.  The cup was positioned anatomically within the acetabular portion of the pelvis.      At this point, the femur was rolled at 80 degrees.  Further capsule was   released off the inferior aspect of the femoral neck.  I then   released the superior capsule proximally.  The hook was placed laterally   along the femur and elevated manually and held in position with the bed   hook.  The leg was then extended and adducted with the leg rolled to 100   degrees of external rotation.  Once the proximal femur was fully   exposed, I used a box osteotome to set orientation.  I then began   broaching with the starting chili pepper broach and passed this by hand and then broached up to 3.  With the 3 broach in place I chose a high offset neckand did a trial reduction with a 36+1.5 ball.  The offset was appropriate, leg lengths   appeared to be equal, confirmed radiographically.   Given these findings, I went ahead and dislocated the hip, repositioned all   retractors and positioned the right hip in the extended and abducted position.  The final 3 high offset Tri Lock stem was   chosen and it was impacted down to the level of neck cut.  Based on this   and the trial reduction, a 36+1.5 delta ceramic ball was chosen and   impacted onto a clean and dry trunnion, and the hip was reduced.  The   hip had been irrigated throughout the case again at this point.  I did   reapproximate the superior capsular leaflet to the anterior leaflet   using #1 Vicryl, placed a medium Hemovac drain deep.  The fascia of the   tensor fascia lata muscle was then reapproximated using #1 Vicryl.  The   remaining wound was closed with 2-0 Vicryl and running 4-0 Monocryl.   The hip was cleaned, dried, and dressed sterilely using Dermabond and   Aquacel dressing.   Drain site dressed separately.  She was then brought   to recovery room in stable condition tolerating the procedure well.    Danae Orleans, PA-C was present for the entirety of the case involved from   preoperative positioning, perioperative retractor management, general   facilitation of the case, as well as primary wound closure as assistant.            Pietro Cassis Alvan Dame, M.D.            MDO/MEDQ  D:  12/15/2010  T:  12/15/2010  Job:  [id number]      Electronically Signed by Paralee Cancel M.D. on 12/21/2010 09:15:38 AM

## 2011-07-01 NOTE — Anesthesia Postprocedure Evaluation (Signed)
  Anesthesia Post-op Note  Patient: William Harrington  Procedure(s) Performed: Procedure(s) (LRB): TOTAL HIP ARTHROPLASTY ANTERIOR APPROACH (Left)  Patient Location: PACU  Anesthesia Type: General  Level of Consciousness: awake and alert   Airway and Oxygen Therapy: Patient Spontanous Breathing  Post-op Pain: mild  Post-op Assessment: Post-op Vital signs reviewed, Patient's Cardiovascular Status Stable, Respiratory Function Stable, Patent Airway and No signs of Nausea or vomiting  Post-op Vital Signs: stable  Complications: No apparent anesthesia complications

## 2011-07-01 NOTE — Preoperative (Signed)
Beta Blockers   Reason not to administer Beta Blockers:Not Applicable 

## 2011-07-01 NOTE — Interval H&P Note (Signed)
History and Physical Interval Note:  07/01/2011 7:41 AM  William Harrington  has presented today for surgery, with the diagnosis of Osteoarthritis left hip  The various methods of treatment have been discussed with the patient and family. After consideration of risks, benefits and other options for treatment, the patient has consented to  Procedure(s) (LRB):LEFT TOTAL HIP ARTHROPLASTY ANTERIOR APPROACH (Left) as a surgical intervention .  The patients' history has been reviewed, patient examined, no change in status, stable for surgery.  I have reviewed the patients' chart and labs.  Questions were answered to the patient's satisfaction.     Shelda Pal

## 2011-07-02 ENCOUNTER — Encounter (HOSPITAL_COMMUNITY): Payer: Self-pay | Admitting: Orthopedic Surgery

## 2011-07-02 LAB — CBC
HCT: 33.6 % — ABNORMAL LOW (ref 39.0–52.0)
MCV: 92.1 fL (ref 78.0–100.0)
RBC: 3.65 MIL/uL — ABNORMAL LOW (ref 4.22–5.81)
RDW: 12.7 % (ref 11.5–15.5)
WBC: 11.4 10*3/uL — ABNORMAL HIGH (ref 4.0–10.5)

## 2011-07-02 LAB — GLUCOSE, CAPILLARY
Glucose-Capillary: 184 mg/dL — ABNORMAL HIGH (ref 70–99)
Glucose-Capillary: 227 mg/dL — ABNORMAL HIGH (ref 70–99)

## 2011-07-02 LAB — BASIC METABOLIC PANEL
BUN: 22 mg/dL (ref 6–23)
CO2: 27 mEq/L (ref 19–32)
Chloride: 98 mEq/L (ref 96–112)
GFR calc Af Amer: 50 mL/min — ABNORMAL LOW (ref >90)
Potassium: 3.9 mEq/L (ref 3.5–5.1)

## 2011-07-02 MED ORDER — ASPIRIN EC 325 MG PO TBEC: 325.0000 mg | DELAYED_RELEASE_TABLET | Freq: Two times a day (BID) | ORAL | Status: AC

## 2011-07-02 MED ORDER — DSS 100 MG PO CAPS: 100.0000 mg | ORAL_CAPSULE | Freq: Two times a day (BID) | ORAL | Status: AC

## 2011-07-02 MED ORDER — HYDROCODONE-ACETAMINOPHEN 7.5-325 MG PO TABS: 1.0000 | ORAL_TABLET | ORAL | Status: AC | PRN

## 2011-07-02 MED ORDER — POLYETHYLENE GLYCOL 3350 17 G PO PACK: 17.0000 g | PACK | Freq: Two times a day (BID) | ORAL | Status: AC

## 2011-07-02 MED ORDER — METHOCARBAMOL 500 MG PO TABS: 500.0000 mg | ORAL_TABLET | Freq: Four times a day (QID) | ORAL | Status: AC | PRN

## 2011-07-02 MED ORDER — FERROUS SULFATE 325 (65 FE) MG PO TABS: 325.0000 mg | ORAL_TABLET | Freq: Three times a day (TID) | ORAL | Status: DC

## 2011-07-02 MED ORDER — DIPHENHYDRAMINE HCL 25 MG PO CAPS: 25.0000 mg | ORAL_CAPSULE | Freq: Four times a day (QID) | ORAL | Status: AC | PRN

## 2011-07-02 NOTE — Care Management Note (Signed)
    Page 1 of 2   07/05/2011     10:05:53 AM   CARE MANAGEMENT NOTE 07/05/2011  Patient:  William Harrington, William Harrington   Account Number:  [id number]  Date Initiated:  07/01/2011  Documentation initiated by:  Colleen Can  Subjective/Objective Assessment:   dx left hip osteoarthritis; total hip replacemnt -anterior approach     Action/Plan:   pt/ot pending   Anticipated DC Date:  07/03/2011   Anticipated DC Plan:  HOME W HOME HEALTH SERVICES      DC Planning Services  CM consult      [facility] Choice  DURABLE MEDICAL EQUIPMENT  HOME HEALTH   Choice offered to / List presented to:  C-1 Patient   DME arranged  NA      DME agency  NA     HH arranged  HH-2 PT      [facility] agency  [facility]   Status of service:  Completed, signed off Medicare Important Message given?  NA - LOS <3 / Initial given by admissions (If response is "NO", the following Medicare IM given date fields will be blank) Date Medicare IM given:   Date Additional Medicare IM given:    Discharge Disposition:  HOME W HOME HEALTH SERVICES  Per UR Regulation:  Reviewed for med. necessity/level of care/duration of stay  If discussed at Long Length of Stay Meetings, dates discussed:    Comments:  07/03/11 1300 Tymeeka Davis,Rn,BSN [phone] Cm spoke with pt concerning dc planning with spouse at the bedside. Pt confirmed previous dc plan with [place] providing HH services. Pt has DME for home use in room during interview. Spouse to assist in home care.   07/02/2011 Raynelle Bring BSN CCM [phone] CM spoke with patient .Plans are for patient to return to his home in [facility]). Spouse will be caregiver. States he already has RW that was delivered to room. List of [facility] agencies given to patient and  placed on shadow chart. Pt chose agency that he was refered to by doctor's office-[facility]. They will render [facility] services ordered and services will start day after discharge.

## 2011-07-02 NOTE — Progress Notes (Signed)
Subjective: 1 Day Post-Op Procedure(s) (LRB): TOTAL HIP ARTHROPLASTY ANTERIOR APPROACH (Left)   Patient reports pain as mild. Pain well controlled with medication. No events throughout the night.   Objective:   VITALS:   Filed Vitals:   07/02/11 0535  BP: 125/81  Pulse: 91  Temp: 98.4 F (36.9 C)  Resp: 16    Neurovascular intact Dorsiflexion/Plantar flexion intact Incision: dressing C/D/I No cellulitis present Compartment soft  LABS  Basename 07/02/11 0400  HGB 11.6*  HCT 33.6*  WBC 11.4*  PLT 175     Basename 07/02/11 0400  NA 134*  K 3.9  BUN 22  CREATININE 1.57*  GLUCOSE 172*     Assessment/Plan: 1 Day Post-Op Procedure(s) (LRB): TOTAL HIP ARTHROPLASTY ANTERIOR APPROACH (Left)   HV drain d/c'ed Foley cath d/c'ed Advance diet Up with therapy D/C IV fluids Plan for discharge tomorrow to home if continues to do well.   Anastasio Auerbach Erol Flanagin   PAC  07/02/2011, 9:00 AM

## 2011-07-02 NOTE — Evaluation (Signed)
Occupational Therapy Evaluation Patient Details Name: William Harrington MRN: [id number] DOB: 04-Jul-1940 Today's Date: 07/02/2011 Time:  -     OT Assessment / Plan / Recommendation Clinical Impression  Pt doing well POD1 LTHR. All education completed. Pt will have necessary level of A at d/c from family.    OT Assessment  Patient does not need any further OT services    Follow Up Recommendations  No OT follow up    Barriers to Discharge      Equipment Recommendations  Rolling walker with 5" wheels    Recommendations for Other Services    Frequency       Precautions / Restrictions Restrictions Weight Bearing Restrictions: No Other Position/Activity Restrictions: WBAT   Pertinent Vitals/Pain     ADL  Grooming: Performed;Wash/dry hands;Supervision/safety Where Assessed - Grooming: Standing at sink Upper Body Bathing: Simulated;Set up Where Assessed - Upper Body Bathing: Sitting, chair;Unsupported Lower Body Bathing: Simulated;Minimal assistance Where Assessed - Lower Body Bathing: Sit to stand from chair Upper Body Dressing: Simulated;Set up Where Assessed - Upper Body Dressing: Sitting, chair;Unsupported Lower Body Dressing: Simulated;Minimal assistance Where Assessed - Lower Body Dressing: Sit to stand from chair Toilet Transfer: Performed;Supervision/safety Toilet Transfer Method: Proofreader: Regular height toilet Toileting - Clothing Manipulation: Performed;Supervision/safety Where Assessed - Toileting Clothing Manipulation: Sit to stand from 3-in-1 or toilet Toileting - Hygiene: Performed;Supervision/safety Where Assessed - Toileting Hygiene: Sit to stand from 3-in-1 or toilet Tub/Shower Transfer: Performed;Other (comment) (Minguard A) Tub/Shower Transfer Method: Science writer: Walk in shower Equipment Used: Rolling walker    OT Diagnosis:    OT Problem List:   OT Treatment Interventions:     OT Goals     Visit Information  Last OT Received On: 07/02/11 Assistance Needed: +1    Subjective Data      Prior Functioning  Home Living Lives With: Spouse Available Help at Discharge: Family Type of Home: House Home Access: Stairs to enter Secretary/administrator of Steps: 4 Entrance Stairs-Rails: Can reach both;Left;Right Home Layout: Able to live on main level with bedroom/bathroom Bathroom Shower/Tub: Health visitor: Standard Home Adaptive Equipment: Built-in shower seat Prior Function Level of Independence: Independent Able to Take Stairs?: Yes Driving: Yes Vocation: Full time employment Communication Communication: No difficulties Dominant Hand: Right    Cognition  Overall Cognitive Status: Appears within functional limits for tasks assessed/performed Arousal/Alertness: Awake/alert Orientation Level: Appears intact for tasks assessed Behavior During Session: [facility] for tasks performed    Extremity/Trunk Assessment Right Upper Extremity Assessment RUE ROM/Strength/Tone: [facility] for tasks assessed Left Upper Extremity Assessment LUE ROM/Strength/Tone: WFL for tasks assessed    Mobility Bed Mobility Bed Mobility: Sit to Supine Sit to Supine: 4: Min assist;HOB flat;With rail Details for Bed Mobility Assistance: A needed for LLE. Transfers Sit to Stand: 4: Min guard;With upper extremity assist;With armrests;From chair/3-in-1;From toilet Stand to Sit: 5: Supervision;With upper extremity assist;With armrests;To toilet;To chair/3-in-1 Details for Transfer Assistance: min amt of VCs for hand placement & LE position.   Exercise   Balance    End of Session OT - End of Session Activity Tolerance: Patient tolerated treatment well Patient left: in bed;with call bell/phone within reach   Vandora Jaskulski A OTR/L [phone] 07/02/2011, 1:00 PM

## 2011-07-02 NOTE — Evaluation (Signed)
Physical Therapy Evaluation Patient Details Name: William Harrington MRN: [id number] DOB: 1941-02-04 Today's Date: 07/02/2011 Time: 4098-1191 PT Time Calculation (min): 23 min  PT Assessment / Plan / Recommendation Clinical Impression  Pt with L THR presents wtih decreased L LE strength/ROM and limitations in functional mobiltiy    PT Assessment  Patient needs continued PT services    Follow Up Recommendations  Home health PT    Barriers to Discharge        lEquipment Recommendations  Rolling walker with 5" wheels    Recommendations for Other Services OT consult   Frequency 7X/week    Precautions / Restrictions Restrictions Weight Bearing Restrictions: No Other Position/Activity Restrictions: WBAT   Pertinent Vitals/Pain 6/10 with activity      Mobility  Transfers Transfers: Sit to Stand;Stand to Sit Sit to Stand: 4: Min assist Stand to Sit: 4: Min assist Details for Transfer Assistance: cues for LE management and use of UEs to self assist Ambulation/Gait Ambulation/Gait Assistance: 4: Min assist Ambulation Distance (Feet): 32 Feet Assistive device: Rolling walker Ambulation/Gait Assistance Details: cues for posture, sequence and position from RW Gait Pattern: Step-to pattern    Exercises Total Joint Exercises Ankle Circles/Pumps: AROM;10 reps;Both;Supine Quad Sets: AROM;10 reps;Both;Supine Heel Slides: AAROM;10 reps;Supine;Left Hip ABduction/ADduction: AAROM;10 reps;Left;Supine   PT Diagnosis: Difficulty walking  PT Problem List: Decreased strength;Decreased range of motion;Decreased activity tolerance;Decreased mobility;Decreased knowledge of use of DME;Pain PT Treatment Interventions: DME instruction;Gait training;Stair training;Functional mobility training;Therapeutic activities;Therapeutic exercise;Patient/family education   PT Goals Acute Rehab PT Goals PT Goal Formulation: With patient Time For Goal Achievement: 07/07/11 Potential to Achieve Goals:  Good Pt will go Supine/Side to Sit: with supervision PT Goal: Supine/Side to Sit - Progress: Goal set today Pt will go Sit to Supine/Side: with supervision PT Goal: Sit to Supine/Side - Progress: Goal set today Pt will go Sit to Stand: with supervision PT Goal: Sit to Stand - Progress: Goal set today Pt will go Stand to Sit: with supervision PT Goal: Stand to Sit - Progress: Goal set today Pt will Ambulate: 51 - 150 feet;with supervision;with rolling walker PT Goal: Ambulate - Progress: Goal set today Pt will Go Up / Down Stairs: 3-5 stairs;with min assist;with least restrictive assistive device PT Goal: Up/Down Stairs - Progress: Goal set today  Visit Information  Last PT Received On: 07/02/11 Assistance Needed: +1    Subjective Data  Subjective: I want to put my pants on so I don't moon everyone Patient Stated Goal: Resume previous active lifestyle with decreased pain   Prior Functioning  Home Living Lives With: Spouse Available Help at Discharge: Family Type of Home: House Home Access: Stairs to enter Secretary/administrator of Steps: 4 Entrance Stairs-Rails: Can reach both Home Layout: Able to live on main level with bedroom/bathroom Prior Function Level of Independence: Independent Able to Take Stairs?: Yes Communication Communication: No difficulties    Cognition  Overall Cognitive Status: Appears within functional limits for tasks assessed/performed Arousal/Alertness: Awake/alert Orientation Level: Appears intact for tasks assessed Behavior During Session: [facility] for tasks performed    Extremity/Trunk Assessment Right Upper Extremity Assessment RUE ROM/Strength/Tone: Within functional levels Left Upper Extremity Assessment LUE ROM/Strength/Tone: Within functional levels Right Lower Extremity Assessment RLE ROM/Strength/Tone: Within functional levels Left Lower Extremity Assessment LLE ROM/Strength/Tone: Deficits LLE ROM/Strength/Tone Deficits: HIP STRENGTH 2+/5;  HIP FLEX LIMITED TO 80 2* discomfort   Balance    End of Session PT - End of Session Activity Tolerance: Patient tolerated treatment well Patient left: in  chair;with call bell/phone within reach;with family/visitor present Nurse Communication: Mobility status   Pranika Finks 07/02/2011, 12:25 PM

## 2011-07-02 NOTE — Progress Notes (Signed)
Physical Therapy Treatment Patient Details Name: William Harrington MRN: [id number] DOB: 1941/01/12 Today's Date: 07/02/2011 Time: 1343-1400 PT Time Calculation (min): 17 min  PT Assessment / Plan / Recommendation Comments on Treatment Session  Patient ambulating further this pm despite pain and due for meds.  Toileted in standing after assist for set up.    Follow Up Recommendations  Home health PT            Equipment Recommendations  Rolling walker with 5" wheels        Frequency 7X/week   Plan Discharge plan remains appropriate    Precautions / Restrictions Precautions Precautions: None Restrictions Weight Bearing Restrictions: No Other Position/Activity Restrictions: WBAT   Pertinent Vitals/Pain 6/10 in left hip; RN made aware and ice applied.    Mobility  Bed Mobility Bed Mobility: Supine to Sit Supine to Sit: 4: Min assist Sit to Supine: 4: Min assist Details for Bed Mobility Assistance: assist for left LE Transfers Sit to Stand: 4: Min guard;With upper extremity assist;From bed Stand to Sit: 5: Supervision;To bed;With upper extremity assist Details for Transfer Assistance: min cues for technique and safety Ambulation/Gait Ambulation/Gait Assistance: 4: Min guard Ambulation Distance (Feet): 92 Feet Assistive device: Rolling walker Ambulation/Gait Assistance Details: cues for positioning Gait Pattern: Step-through pattern;Decreased step length - right;Decreased step length - left             PT Goals Acute Rehab PT Goals Pt will go Supine/Side to Sit: with supervision PT Goal: Supine/Side to Sit - Progress: Progressing toward goal Pt will go Sit to Supine/Side: with supervision PT Goal: Sit to Supine/Side - Progress: Progressing toward goal Pt will go Sit to Stand: with supervision PT Goal: Sit to Stand - Progress: Progressing toward goal Pt will go Stand to Sit: with supervision PT Goal: Stand to Sit - Progress: Progressing toward goal Pt will Ambulate:  51 - 150 feet;with supervision;with rolling walker PT Goal: Ambulate - Progress: Progressing toward goal  Visit Information  Last PT Received On: 07/02/11 Assistance Needed: +1    Subjective Data      Cognition  Overall Cognitive Status: Appears within functional limits for tasks assessed/performed Arousal/Alertness: Awake/alert Orientation Level: Appears intact for tasks assessed Behavior During Session: [facility] for tasks performed    Balance     End of Session PT - End of Session Equipment Utilized During Treatment: Gait belt Activity Tolerance: Patient tolerated treatment well Patient left: in bed;with call bell/phone within reach Nurse Communication: Patient requests pain meds    Pietrina Jagodzinski,CYNDI 07/02/2011, 2:57 PM

## 2011-07-03 LAB — BASIC METABOLIC PANEL
BUN: 22 mg/dL (ref 6–23)
Creatinine, Ser: 1.51 mg/dL — ABNORMAL HIGH (ref 0.50–1.35)
GFR calc Af Amer: 53 mL/min — ABNORMAL LOW (ref >90)
GFR calc non Af Amer: 45 mL/min — ABNORMAL LOW (ref >90)

## 2011-07-03 LAB — CBC
HCT: 34.4 % — ABNORMAL LOW (ref 39.0–52.0)
MCHC: 34 g/dL (ref 30.0–36.0)
MCV: 93 fL (ref 78.0–100.0)
Platelets: 166 10*3/uL (ref 150–400)
RDW: 12.7 % (ref 11.5–15.5)

## 2011-07-03 NOTE — Progress Notes (Signed)
Discharged to family auto via W/C. Assessment unchanged from am.

## 2011-07-03 NOTE — Care Management (Signed)
Cm spoke with pt concerning dc planning with spouse at the bedside. Pt confirmed previous dc plan with [place] providing HH services. Pt has DME for home use in room during interview. Spouse to assist in home care.    Leonie Green [phone]

## 2011-07-03 NOTE — Progress Notes (Signed)
Subjective: Pt doing well ,no complaints   Objective: Vital signs in last 24 hours: Temp:  [98.1 F (36.7 C)-98.5 F (36.9 C)] 98.1 F (36.7 C) (05/11 0609) Pulse Rate:  [91-98] 93  (05/11 0609) Resp:  [18-20] 18  (05/11 0609) BP: (142-153)/(70-90) 143/90 mmHg (05/11 0609) SpO2:  [95 %-100 %] 95 % (05/11 0609)  Intake/Output from previous day: 05/10 0701 - 05/11 0700 In: 850.5 [P.O.:480; I.V.:370.5] Out: 375 [Urine:375] Intake/Output this shift:     Basename 07/03/11 0423 07/02/11 0400  HGB 11.7* 11.6*    Basename 07/03/11 0423 07/02/11 0400  WBC 12.5* 11.4*  RBC 3.70* 3.65*  HCT 34.4* 33.6*  PLT 166 175    Basename 07/03/11 0423 07/02/11 0400  NA 135 134*  K 4.1 3.9  CL 97 98  CO2 29 27  BUN 22 22  CREATININE 1.51* 1.57*  GLUCOSE 178* 172*  CALCIUM 9.6 8.9   No results found for this basename: LABPT:2,INR:2 in the last 72 hours  Pt cons alert no distress sitting up in bed flexing left hip to 90 deg. Dressing intact, hemavac site no infect but serosang d/c redressed. Thigh, calf soft nt foot NMVI  Assessment/Plan: POD#2 S/P left THA ant app. Doing well Plan OOB with PT D/C home today if continue well with PT.   William Harrington 07/03/2011, 8:18 AM

## 2011-07-03 NOTE — Discharge Summary (Signed)
Physician Discharge Summary  Patient ID: GOR VESTAL MRN: [id number] DOB/AGE: [birth year] 71 y.o.  Admit date: 07/01/2011 Discharge date: 07/03/2011  Procedures:  Procedure(s) (LRB): TOTAL HIP ARTHROPLASTY ANTERIOR APPROACH (Left)  Attending Physician:  Dr. Durene Romans   Admission Diagnoses: Left hip OA and pain   Discharge Diagnoses:  Principal Problem:  *S/P left THA, AA Coronary artery disease  Diabetes mellitus   Hypertension   Hyperlipidemia   Anemia   GERD   Arthritis  Renal insufficiency   Sleep apnea   HPI: Pt is a 71 y.o. male complaining of left hip pain for 2+ years. Pain had continually increased since the beginning, denies any incident or trauma. X-rays in the clinic show end-stage arthritic changes of the left hip. Pt has tried various conservative treatments which have failed to alleviate their symptoms. Various options are discussed with the patient. Risks, benefits and expectations were discussed with the patient. Patient understand the risks, benefits and expectations and wishes to proceed with surgery.    PCP: No primary provider on file.   Discharged Condition: good  Hospital Course:  Patient underwent the above stated procedure on 07/01/2011. Patient tolerated the procedure well and brought to the recovery room in good condition and subsequently to the floor.  POD #1 BP: 125/81 ; Pulse: 91 ; Temp: 98.4 F (36.9 C) ; Resp: 16  Pt's foley was removed, as well as the hemovac drain removed. IV was changed to a saline lock. Patient reports pain as mild. Pain well controlled with medication. No events throughout the night.  Neurovascular intact, dorsiflexion/plantar flexion intact, incision: dressing C/D/I, no cellulitis present and compartment soft.   LABS  Basename  07/02/11 0400   HGB  11.6  HCT  33.6   POD #2  BP: 143/90 ; Pulse: 93 ; Temp: 98.1 F (36.7 C) ; Resp: 18  Pt doing well ,no complaints. Ready to be discharged home with home health  PT. Neurovascular intact, dorsiflexion/plantar flexion intact, incision: dressing scant drainage at hemovac site, no cellulitis present and compartment soft.  LABS  Basename  07/03/11 0423   HGB  11.7  HCT  34.4    Discharge Exam: General appearance: alert, cooperative and no distress Extremities: Homans sign is negative, no sign of DVT, no edema, redness or tenderness in the calves or thighs and no ulcers, gangrene or trophic changes  Disposition: Home with follow up in 2 weeks  Follow-up Information    Follow up with OLIN,Terre Zabriskie D in 2 weeks.   Contact information:   [facility] [address], [address] [place] [address] [phone]          Discharge Orders    Future Orders Please Complete By Expires   Diet - low sodium heart healthy      Call MD / Call 911      Comments:   If you experience chest pain or shortness of breath, CALL 911 and be transported to the hospital emergency room.  If you develope a fever above 101 F, pus (white drainage) or increased drainage or redness at the wound, or calf pain, call your surgeon's office.   Discharge instructions      Comments:   Maintain surgical dressing for 8 days, then replace with gauze and tape. Keep the area dry and clean until follow up. Follow up in 2 weeks at [facility]. Call with any questions or concerns.     Constipation Prevention      Comments:  Drink plenty of fluids.  Prune juice may be helpful.  You may use a stool softener, such as Colace (over the counter) 100 mg twice a day.  Use MiraLax (over the counter) for constipation as needed.   Increase activity slowly as tolerated      Weight Bearing as taught in Physical Therapy      Comments:   Use a walker or crutches as instructed.   Driving restrictions      Comments:   No driving for 4 weeks   Change dressing      Comments:   Maintain surgical dressing for 8 days, then replace with 4x4 guaze and tape.  Keep the area dry and clean.   TED hose      Comments:   Use stockings (TED hose) for 2 weeks on both leg(s).  You may remove them at night for sleeping.      Discharge Medication List as of 07/03/2011  8:55 AM    START taking these medications   Details  diphenhydrAMINE (BENADRYL) 25 mg capsule Take 1 capsule (25 mg total) by mouth every 6 (six) hours as needed for itching, allergies or sleep., Starting 07/02/2011, Until Mon 07/12/11, No Print    docusate sodium 100 MG CAPS Take 100 mg by mouth 2 (two) times daily., Starting 07/02/2011, Until Mon 07/12/11, No Print    ferrous sulfate 325 (65 FE) MG tablet Take 1 tablet (325 mg total) by mouth 3 (three) times daily after meals., Starting 07/02/2011, Until Sat 07/01/12, No Print    HYDROcodone-acetaminophen (NORCO) 7.5-325 MG per tablet Take 1-2 tablets by mouth every 4 (four) hours as needed for pain., Starting 07/02/2011, Until Mon 07/12/11, Print    methocarbamol (ROBAXIN) 500 MG tablet Take 1 tablet (500 mg total) by mouth every 6 (six) hours as needed (muscle spasms)., Starting 07/02/2011, Until Mon 07/12/11, No Print    polyethylene glycol (MIRALAX / GLYCOLAX) packet Take 17 g by mouth 2 (two) times daily., Starting 07/02/2011, Until Mon 07/05/11, No Print      CONTINUE these medications which have CHANGED   Details  aspirin EC 325 MG tablet Take 1 tablet (325 mg total) by mouth 2 (two) times daily. X 4 weeks, Starting 07/02/2011, Until Mon 07/12/11, No Print      CONTINUE these medications which have NOT CHANGED   Details  fish oil-omega-3 fatty acids 1000 MG capsule Take 1 g by mouth 2 (two) times daily. , Until Discontinued, Historical Med    vitamin E (VITAMIN E) 400 UNIT capsule Take 400 Units by mouth daily., Until Discontinued, Historical Med    glipiZIDE (GLUCOTROL) 5 MG tablet Take 2.5 mg by mouth 2 (two) times daily before a meal., Until Discontinued, Historical Med    hydrochlorothiazide (HYDRODIURIL) 25 MG tablet Take 25 mg by  mouth daily with breakfast. , Until Discontinued, Historical Med    lisinopril (PRINIVIL,ZESTRIL) 40 MG tablet Take 40 mg by mouth daily with breakfast., Until Discontinued, Historical Med    metoprolol (LOPRESSOR) 100 MG tablet Take 100 mg by mouth daily with breakfast., Until Discontinued, Historical Med    omeprazole (PRILOSEC) 20 MG capsule Take 20 mg by mouth daily with breakfast. , Until Discontinued, Historical Med    simvastatin (ZOCOR) 80 MG tablet Take 40 mg by mouth at bedtime. Patient takes 1/2 tablet by mouth at bedtime (40mg ), Until Discontinued, Historical Med      STOP taking these medications     acetaminophen (TYLENOL EX ST ARTHRITIS PAIN)  500 MG tablet Comments:  Reason for Stopping:       DICLOFENAC POTASSIUM PO Comments:  Reason for Stopping:          Signed: Anastasio Auerbach. Nahal Wanless   PAC  07/03/2011, 2:13 PM

## 2011-07-03 NOTE — Progress Notes (Signed)
Physical Therapy Treatment Patient Details Name: William Harrington MRN: [id number] DOB: 1940/07/01 Today's Date: 07/03/2011 Time: 4098-1191 PT Time Calculation (min): 24 min  PT Assessment / Plan / Recommendation Comments on Treatment Session  pt doing well; ready for D/C from PT standpoint    Follow Up Recommendations  Home health PT    Barriers to Discharge        Equipment Recommendations   (DME delivered)    Recommendations for Other Services    Frequency 7X/week   Plan Discharge plan remains appropriate    Precautions / Restrictions Precautions Precautions: None Restrictions Weight Bearing Restrictions: No Other Position/Activity Restrictions: WBAT   Pertinent Vitals/Pain     Mobility  Bed Mobility Bed Mobility: Supine to Sit Supine to Sit: 6: Modified independent (Device/Increase time) Transfers Transfers: Sit to Stand;Stand to Sit Sit to Stand: 5: Supervision;With upper extremity assist;From bed Stand to Sit: 5: Supervision;To chair/3-in-1;With upper extremity assist Details for Transfer Assistance: min cues for technique and safety Ambulation/Gait Ambulation/Gait Assistance: 5: Supervision;6: Modified independent (Device/Increase time) Ambulation Distance (Feet): 140 Feet Assistive device: Rolling walker Ambulation/Gait Assistance Details: inital cues for sequence Gait Pattern: Step-to pattern;Step-through pattern    Exercises Total Joint Exercises Ankle Circles/Pumps: AROM;Both;10 reps Quad Sets: PROM;Left;10 reps Heel Slides: AROM;Left;10 reps   PT Diagnosis:    PT Problem List:   PT Treatment Interventions:     PT Goals Acute Rehab PT Goals Time For Goal Achievement: 07/07/11 Potential to Achieve Goals: Good Pt will go Supine/Side to Sit: with supervision PT Goal: Supine/Side to Sit - Progress: Met Pt will go Sit to Stand: with supervision PT Goal: Sit to Stand - Progress: Met Pt will go Stand to Sit: with supervision PT Goal: Stand to Sit -  Progress: Met Pt will Ambulate: 51 - 150 feet;with supervision;with rolling walker PT Goal: Ambulate - Progress: Partly met Pt will Go Up / Down Stairs: 3-5 stairs;with min assist;with least restrictive assistive device PT Goal: Up/Down Stairs - Progress: Met  Visit Information  Last PT Received On: 07/03/11 Assistance Needed: +1    Subjective Data  Subjective: It just burns a little   Cognition  Overall Cognitive Status: Appears within functional limits for tasks assessed/performed Arousal/Alertness: Awake/alert Orientation Level: Appears intact for tasks assessed Behavior During Session: [facility] for tasks performed    Balance     End of Session PT - End of Session Activity Tolerance: Patient tolerated treatment well Patient left: in chair;with call bell/phone within reach;with nursing in room;with family/visitor present    [facility] 07/03/2011, 9:43 AM

## 2011-07-12 ENCOUNTER — Other Ambulatory Visit (HOSPITAL_COMMUNITY): Payer: Self-pay | Admitting: Orthopedic Surgery

## 2011-07-15 ENCOUNTER — Other Ambulatory Visit (HOSPITAL_COMMUNITY): Payer: Self-pay | Admitting: Orthopedic Surgery

## 2011-07-15 DIAGNOSIS — T148XXA Other injury of unspecified body region, initial encounter: Secondary | ICD-10-CM

## 2011-07-16 ENCOUNTER — Other Ambulatory Visit: Payer: Self-pay | Admitting: Internal Medicine

## 2011-07-16 ENCOUNTER — Other Ambulatory Visit: Payer: Self-pay | Admitting: Hematology and Oncology

## 2011-07-16 ENCOUNTER — Other Ambulatory Visit (HOSPITAL_COMMUNITY): Payer: Self-pay | Admitting: Orthopedic Surgery

## 2011-07-16 DIAGNOSIS — T148XXA Other injury of unspecified body region, initial encounter: Secondary | ICD-10-CM

## 2011-08-05 ENCOUNTER — Ambulatory Visit: Payer: Self-pay | Admitting: Nephrology

## 2012-08-14 IMAGING — US US RENAL KIDNEY
1 series · 14 of 25 positions shown · non-contrast
Comparison: none

REASON FOR EXAM: proteinuria
COMMENTS:

[Series 1: us renal kidney · 0.31mm/px · 14 of 42 slices shown]
[im 1/42]
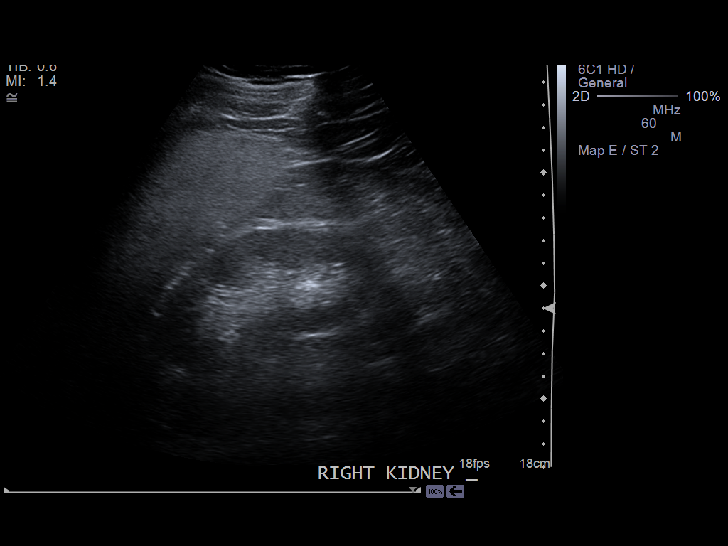
[im 4/42]
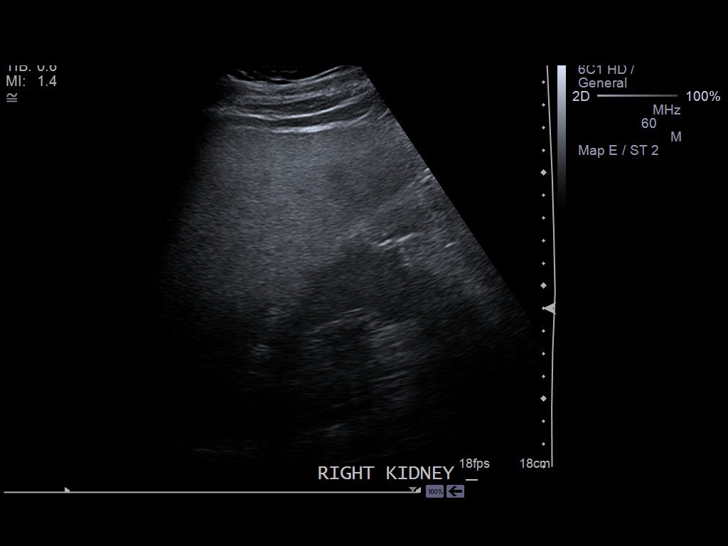
[im 7/42]
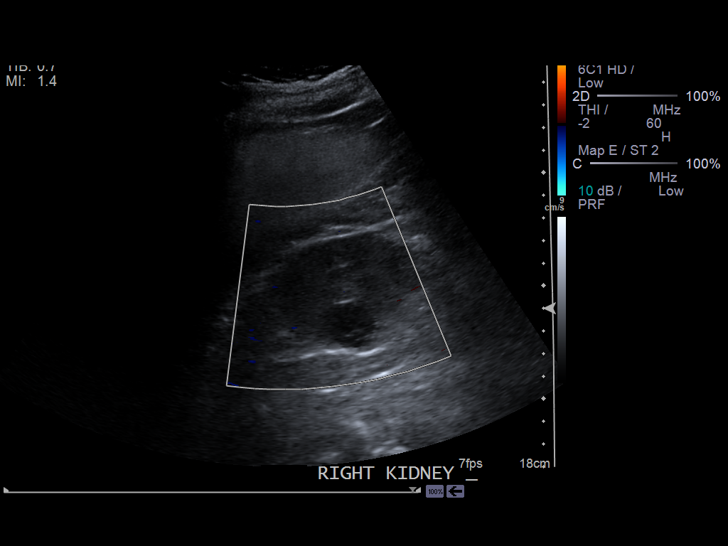
[im 11/42]
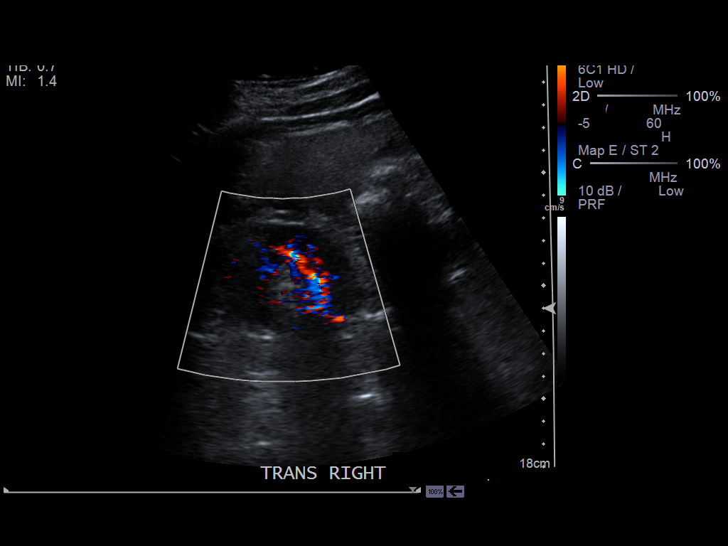
[im 14/42]
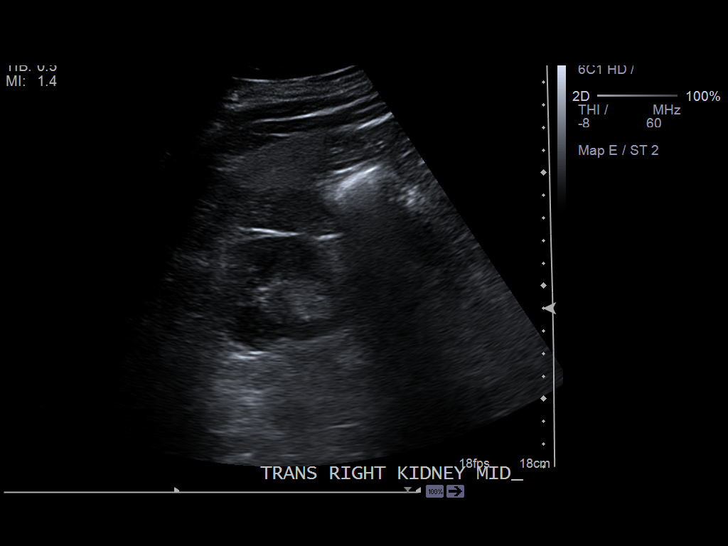
[im 16/42]
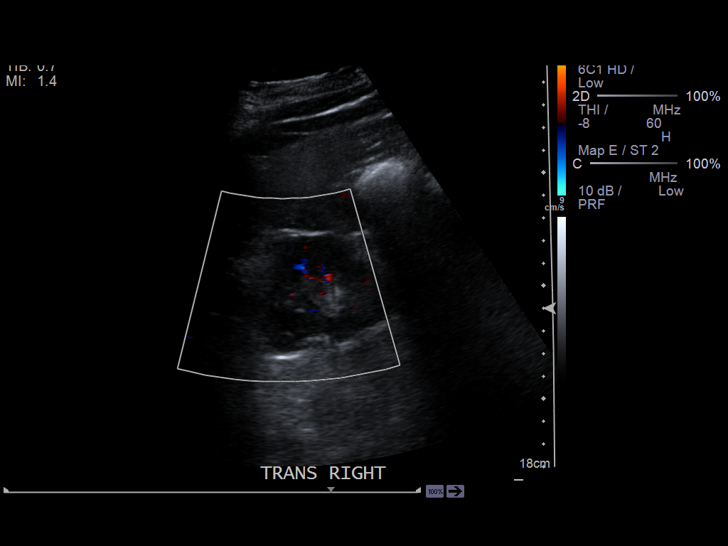
[im 19/42]
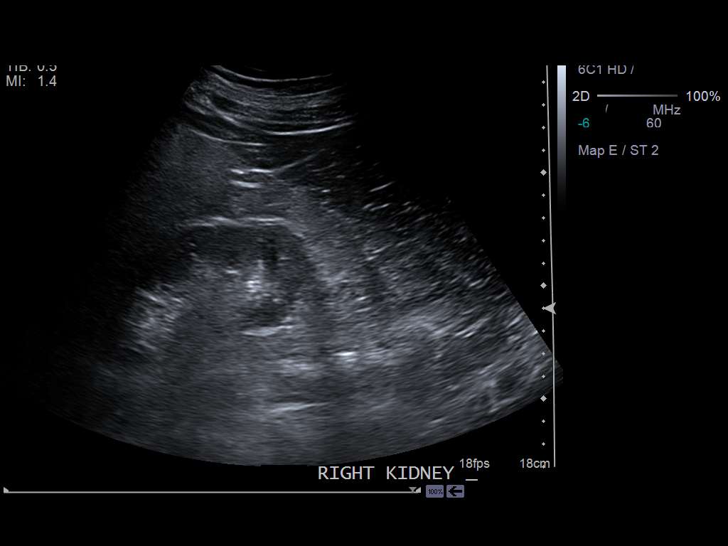
[im 23/42]
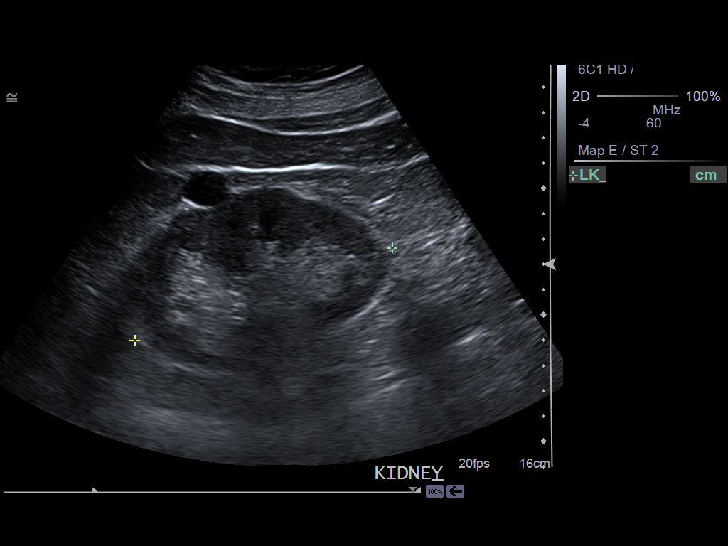
[im 26/42]
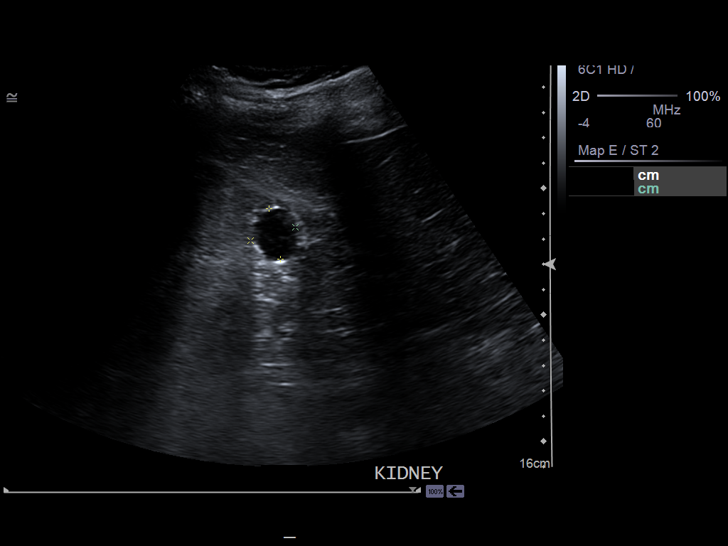
[im 28/42]
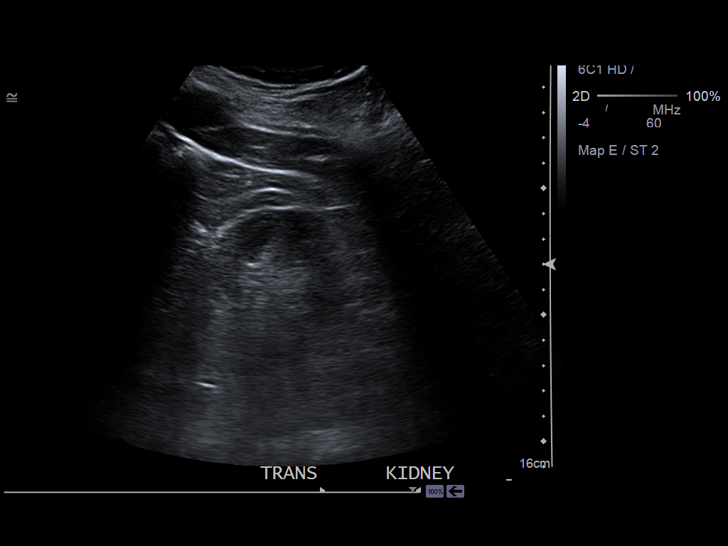
[im 31/42]
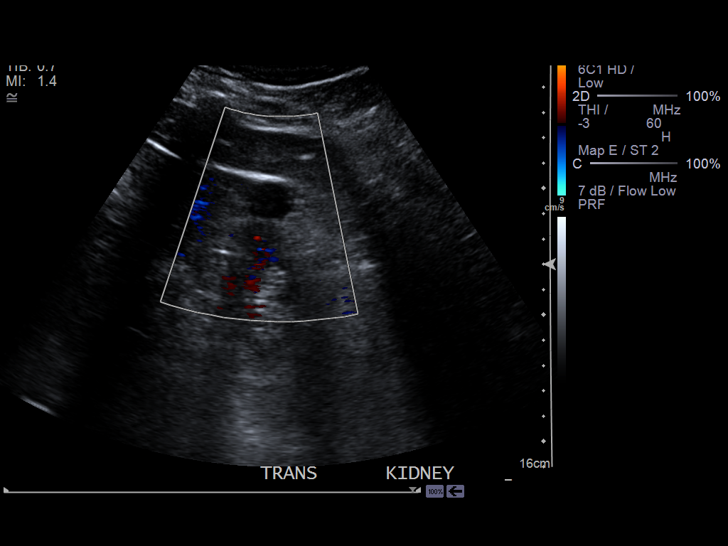
[im 35/42]
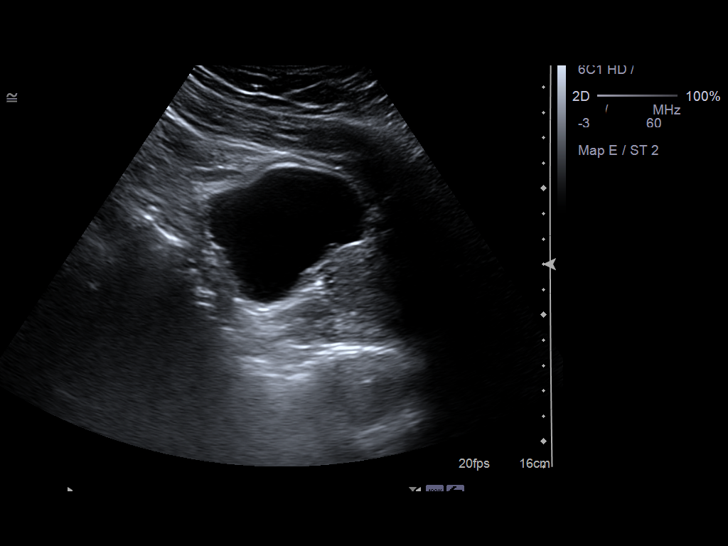
[im 38/42]
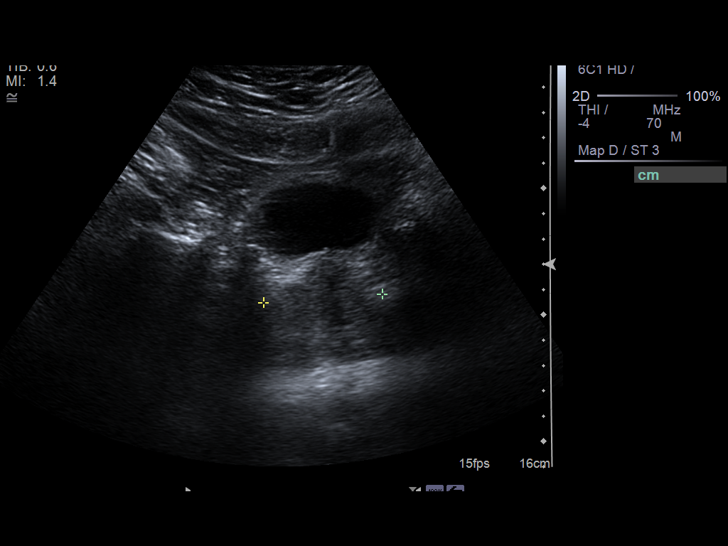
[im 42/42]
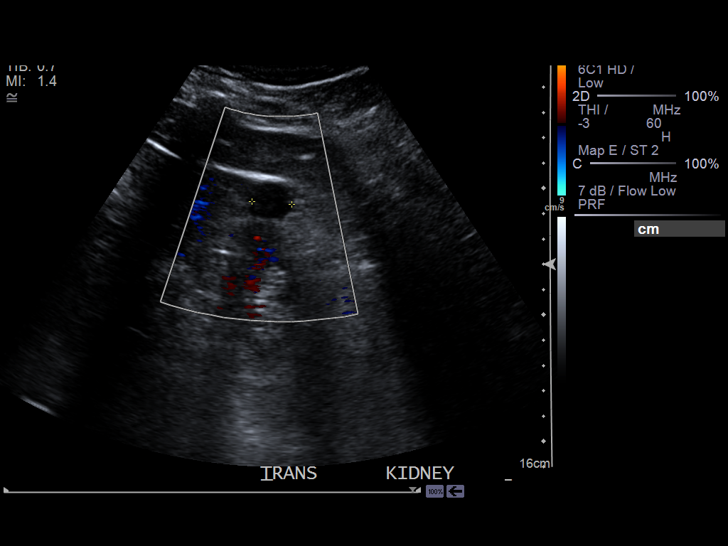

[14 of 25 positions shown; findings below may reference images not displayed]

PROCEDURE:     US  - US KIDNEY  - August 05, 2011 [DATE]

RESULT:     Renal sonogram is performed in the standard fashion. There is no
previous exam for comparison. The right kidney measures 11.14 x 4.83 x
cm. There is a cystic area of 2.56 x 1.93 x 2.55 cm near the mid portion of
the kidney which is not clearly cystic. Triphasic CT follow-up is
recommended. The left kidney measures 10.81 x 4.56 x 5.46 cm and
demonstrates an exophytic, hypoechoic cystic area measuring 2.04 x 1.85 x
1.58 cm. The prostate measures 4.70 x 4.32 x 4.12 cm. There is a small
amount of urine in the bladder.
IMPRESSION: 1.     Hypoechoic complex lesions in both kidneys. Follow-up triphasic CT of
the abdomen is recommended if the patient's renal condition permits. These
are not clearly simple cysts.

[REDACTED]

## 2015-06-05 DIAGNOSIS — C4492 Squamous cell carcinoma of skin, unspecified: Secondary | ICD-10-CM

## 2015-06-05 HISTORY — DX: Squamous cell carcinoma of skin, unspecified: C44.92

## 2016-02-24 ENCOUNTER — Other Ambulatory Visit: Payer: Self-pay | Admitting: Family Medicine

## 2016-02-24 DIAGNOSIS — Z87891 Personal history of nicotine dependence: Secondary | ICD-10-CM

## 2016-03-03 ENCOUNTER — Encounter (INDEPENDENT_AMBULATORY_CARE_PROVIDER_SITE_OTHER): Payer: Self-pay

## 2016-03-03 ENCOUNTER — Ambulatory Visit
Admission: RE | Admit: 2016-03-03 | Discharge: 2016-03-03 | Disposition: A | Discharge status: Home / Self Care | Payer: Medicare Other | Source: Ambulatory Visit | Attending: Family Medicine | Admitting: Family Medicine

## 2016-03-03 DIAGNOSIS — I77811 Abdominal aortic ectasia: Secondary | ICD-10-CM | POA: Insufficient documentation

## 2016-03-03 DIAGNOSIS — Z136 Encounter for screening for cardiovascular disorders: Secondary | ICD-10-CM | POA: Insufficient documentation

## 2016-03-03 DIAGNOSIS — Z87891 Personal history of nicotine dependence: Secondary | ICD-10-CM | POA: Insufficient documentation

## 2017-04-11 ENCOUNTER — Emergency Department
Admission: EM | Admit: 2017-04-11 | Discharge: 2017-04-11 | Disposition: A | Discharge status: Home / Self Care | Payer: Medicare Other | Attending: Emergency Medicine | Admitting: Emergency Medicine

## 2017-04-11 ENCOUNTER — Emergency Department: Payer: Medicare Other

## 2017-04-11 ENCOUNTER — Other Ambulatory Visit: Payer: Self-pay

## 2017-04-11 DIAGNOSIS — Z7984 Long term (current) use of oral hypoglycemic drugs: Secondary | ICD-10-CM | POA: Diagnosis not present

## 2017-04-11 DIAGNOSIS — I10 Essential (primary) hypertension: Secondary | ICD-10-CM | POA: Insufficient documentation

## 2017-04-11 DIAGNOSIS — E119 Type 2 diabetes mellitus without complications: Secondary | ICD-10-CM | POA: Diagnosis not present

## 2017-04-11 DIAGNOSIS — M10271 Drug-induced gout, right ankle and foot: Secondary | ICD-10-CM | POA: Diagnosis not present

## 2017-04-11 DIAGNOSIS — Z79899 Other long term (current) drug therapy: Secondary | ICD-10-CM | POA: Insufficient documentation

## 2017-04-11 DIAGNOSIS — Z96642 Presence of left artificial hip joint: Secondary | ICD-10-CM | POA: Diagnosis not present

## 2017-04-11 DIAGNOSIS — I2510 Atherosclerotic heart disease of native coronary artery without angina pectoris: Secondary | ICD-10-CM | POA: Insufficient documentation

## 2017-04-11 DIAGNOSIS — M79671 Pain in right foot: Secondary | ICD-10-CM | POA: Diagnosis present

## 2017-04-11 DIAGNOSIS — Z87891 Personal history of nicotine dependence: Secondary | ICD-10-CM | POA: Insufficient documentation

## 2017-04-11 LAB — COMPREHENSIVE METABOLIC PANEL
ALT: 24 U/L (ref 17–63)
AST: 27 U/L (ref 15–41)
Albumin: 4.1 g/dL (ref 3.5–5.0)
Alkaline Phosphatase: 77 U/L (ref 38–126)
Anion gap: 8 (ref 5–15)
BUN: 29 mg/dL — ABNORMAL HIGH (ref 6–20)
CO2: 28 mmol/L (ref 22–32)
Calcium: 9.6 mg/dL (ref 8.9–10.3)
Chloride: 103 mmol/L (ref 101–111)
Creatinine, Ser: 1.71 mg/dL — ABNORMAL HIGH (ref 0.61–1.24)
GFR calc Af Amer: 43 mL/min — ABNORMAL LOW (ref >60)
GFR calc non Af Amer: 37 mL/min — ABNORMAL LOW (ref >60)
Glucose, Bld: 133 mg/dL — ABNORMAL HIGH (ref 65–99)
Potassium: 3.6 mmol/L (ref 3.5–5.1)
Sodium: 139 mmol/L (ref 135–145)
Total Bilirubin: 0.7 mg/dL (ref 0.3–1.2)
Total Protein: 7.3 g/dL (ref 6.5–8.1)

## 2017-04-11 LAB — CBC WITH DIFFERENTIAL/PLATELET
Basophils Absolute: 0 10*3/uL (ref 0–0.1)
Basophils Relative: 0 %
Eosinophils Absolute: 0.5 10*3/uL (ref 0–0.7)
Eosinophils Relative: 4 %
HCT: 47.3 % (ref 40.0–52.0)
Hemoglobin: 15.9 g/dL (ref 13.0–18.0)
Lymphocytes Relative: 16 %
Lymphs Abs: 1.8 10*3/uL (ref 1.0–3.6)
MCH: 31.9 pg (ref 26.0–34.0)
MCHC: 33.5 g/dL (ref 32.0–36.0)
MCV: 95.1 fL (ref 80.0–100.0)
Monocytes Absolute: 1.5 10*3/uL — ABNORMAL HIGH (ref 0.2–1.0)
Monocytes Relative: 13 %
Neutro Abs: 7.4 10*3/uL — ABNORMAL HIGH (ref 1.4–6.5)
Neutrophils Relative %: 67 %
Platelets: 163 10*3/uL (ref 150–440)
RBC: 4.98 MIL/uL (ref 4.40–5.90)
RDW: 13.6 % (ref 11.5–14.5)
WBC: 11.1 10*3/uL — ABNORMAL HIGH (ref 3.8–10.6)

## 2017-04-11 LAB — URIC ACID: Uric Acid, Serum: 8.9 mg/dL — ABNORMAL HIGH (ref 4.4–7.6)

## 2017-04-11 MED ORDER — PREDNISONE 50 MG PO TABS: ORAL_TABLET | ORAL | 0 refills | Status: DC

## 2017-04-11 MED ORDER — PREDNISONE 20 MG PO TABS
60.0000 mg | ORAL_TABLET | Freq: Once | ORAL | Status: AC
  Administered 2017-04-11: 60 mg via ORAL
  Filled 2017-04-11: qty 3

## 2017-04-11 NOTE — ED Notes (Signed)
See triage note  States he had an injury to right foot about 3 months ago  Now having swelling and pain to same area  Denies new injury

## 2017-04-11 NOTE — ED Triage Notes (Signed)
Pt c/o right foot pain and swelling for the past week. States it is hurting in the same area that he injured about 3 months ago when a heavy machine smashed his foot, states he never had it checked, states the bruising went away.

## 2017-04-11 NOTE — ED Provider Notes (Signed)
[facility] Emergency Department Provider Note  ____________________________________________  Time seen: Approximately 9:05 PM  I have reviewed the triage vital signs and the nursing notes.   HISTORY  Chief Complaint Foot Pain    HPI William Harrington is a 77 y.o. male presents to the emergency department with 7 out of 10 right great toe and distal foot pain.  Patient is also noticed erythema of the right great toe and swelling.  Patient denies new instances of trauma.  Patient reports sustaining a right foot contusion approximately 3 months ago.  No prior history of gout.  Patient does have a diet that is high in red meat and does consume alcohol occasionally.  Patient is currently a non-smoker but smoked in the past.  He denies fever and chills.  He denies a history of lower extremity cellulitis.  He denies recent travel but reports that he is sedentary approximately 9 hours a day.  He denies shortness of breath, prior DVT, recent surgery knowledge of current malignancy.  No medications have been attempted prior to presenting to the emergency department.   Past Medical History:  Diagnosis Date  . Anemia yrs ago  . Arthritis   . Coronary artery disease   . Diabetes mellitus   . GERD (gastroesophageal reflux disease)   . Hyperlipidemia   . Hypertension   . Renal insufficiency    creatinine 1.5 baseline   . Sleep apnea    STOPBANG=5    Patient Active Problem List   Diagnosis Date Noted  . 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 CARDIOVASCULAR STRESS TEST  06-01-2010   normal nuclear stress test per dr Ubaldo Glassing. last office visit note dr Tommye Standard 02-01-2011 on chart  . cataract extraction right eye  04-2011  . colonscopy  5 yrs ago  . EYE SURGERY  04-2010   macular hole surgery, right eye  . HEMORRHOID SURGERY  yrs ago  . TOTAL HIP ARTHROPLASTY  07/01/2011   Procedure: TOTAL HIP ARTHROPLASTY ANTERIOR APPROACH;   Surgeon: Mauri Pole, MD;  Location: [facility]S;  Service: Orthopedics;  Laterality: Left;    Prior to Admission medications   Medication Sig Start Date End Date Taking? Authorizing Provider  diphenhydrAMINE (BENADRYL) 25 mg capsule Take 1 capsule (25 mg total) by mouth every 6 (six) hours as needed for itching, allergies or sleep. 07/02/11 07/12/11  Danae Orleans, PA-C  ferrous sulfate 325 (65 FE) MG tablet Take 1 tablet (325 mg total) by mouth 3 (three) times daily after meals. 07/02/11 07/01/12  Danae Orleans, PA-C  fish oil-omega-3 fatty acids 1000 MG capsule Take 1 g by mouth 2 (two) times daily.     [provider]  glipiZIDE (GLUCOTROL) 5 MG tablet Take 2.5 mg by mouth 2 (two) times daily before a meal.    [provider]  hydrochlorothiazide (HYDRODIURIL) 25 MG tablet Take 25 mg by mouth daily with breakfast.     [provider]  lisinopril (PRINIVIL,ZESTRIL) 40 MG tablet Take 40 mg by mouth daily with breakfast.    [provider]  metoprolol (LOPRESSOR) 100 MG tablet Take 100 mg by mouth daily with breakfast.    [provider]  omeprazole (PRILOSEC) 20 MG capsule Take 20 mg by mouth daily with breakfast.     [provider]  predniSONE (DELTASONE) 50 MG tablet Take one 50 mg tablet once a day for 5 days. 04/11/17   Sherral Hammers,  Conley Rolls, PA-C  simvastatin (ZOCOR) 80 MG tablet Take 40 mg by mouth at bedtime. Patient takes 1/2 tablet by mouth at bedtime (40mg )    [provider]  vitamin E (VITAMIN E) 400 UNIT capsule Take 400 Units by mouth daily.    [provider]    Allergies Tetracyclines & related  No family history on file.  Social History Social History   Tobacco Use  . Smoking status: Former Smoker    Packs/day: 2.00    Years: 41.00    Pack years: 82.00    Types: Cigarettes    Last attempt to quit: 02/23/1995    Years since quitting: 22.1  . Smokeless tobacco: Never Used  Substance Use Topics  .  Alcohol use: Yes    Comment:  wine 3-4 nights week  . Drug use: No     Review of Systems  Constitutional: No fever/chills Eyes: No visual changes. No discharge ENT: No upper respiratory complaints. Cardiovascular: no chest pain. Respiratory: no cough. No SOB. Gastrointestinal: No abdominal pain.  No nausea, no vomiting.  No diarrhea.  No constipation. Musculoskeletal: Patient has right great toe pain.  Skin: Negative for rash, abrasions, lacerations, ecchymosis. Neurological: Negative for headaches, focal weakness or numbness.   ____________________________________________   PHYSICAL EXAM:  VITAL SIGNS: ED Triage Vitals  Enc Vitals Group     BP 04/11/17 1816 (!) 150/84     Pulse Rate 04/11/17 1816 76     Resp 04/11/17 1816 17     Temp 04/11/17 1816 97.9 F (36.6 C)     Temp Source 04/11/17 1816 Oral     SpO2 04/11/17 1816 99 %     Weight 04/11/17 1817 194 lb (88 kg)     Height 04/11/17 1817 5\' 7"  (1.702 m)     Head Circumference --      Peak Flow --      Pain Score 04/11/17 1816 7     Pain Loc --      Pain Edu? --      Excl. in [place]? --      Constitutional: Alert and oriented. Well appearing and in no acute distress. Eyes: Conjunctivae are normal. PERRL. EOMI. Head: Atraumatic. Cardiovascular: Normal rate, regular rhythm. Normal S1 and S2.  Good peripheral circulation. Respiratory: Normal respiratory effort without tachypnea or retractions. Lungs CTAB. Good air entry to the bases with no decreased or absent breath sounds. Gastrointestinal: Bowel sounds 4 quadrants. Soft and nontender to palpation. No guarding or rigidity. No palpable masses. No distention. No CVA tenderness. Musculoskeletal: Patient is able to move all 5 right toes.  There is erythema and edema of the right great toe and distal foot.  Palpable dorsalis pedis pulse, right. Neurologic:  Normal speech and language. No gross focal neurologic deficits are appreciated.  Skin:  Skin is warm, dry and  intact. No rash noted.  ____________________________________________   LABS (all labs ordered are listed, but only abnormal results are displayed)  Labs Reviewed  CBC WITH DIFFERENTIAL/PLATELET - Abnormal; Notable for the following components:      Result Value   WBC 11.1 (*)    Neutro Abs 7.4 (*)    Monocytes Absolute 1.5 (*)    All other components within normal limits  COMPREHENSIVE METABOLIC PANEL - Abnormal; Notable for the following components:   Glucose, Bld 133 (*)    BUN 29 (*)    Creatinine, Ser 1.71 (*)    GFR calc non Af Amer 37 (*)  GFR calc Af Amer 43 (*)    All other components within normal limits  URIC ACID - Abnormal; Notable for the following components:   Uric Acid, Serum 8.9 (*)    All other components within normal limits   ____________________________________________  EKG   ____________________________________________  RADIOLOGY Unk Pinto, personally viewed and evaluated these images (plain radiographs) as part of my medical decision making, as well as reviewing the written report by the radiologist.  US Venous Img Lower Unilateral Right  Result Date: 04/11/2017 CLINICAL DATA:  Right foot pain for 2 days EXAM: RIGHT LOWER EXTREMITY VENOUS DOPPLER ULTRASOUND TECHNIQUE: Gray-scale sonography with graded compression, as well as color Doppler and duplex ultrasound were performed to evaluate the lower extremity deep venous systems from the level of the common femoral vein and including the common femoral, femoral, profunda femoral, popliteal and calf veins including the posterior tibial, peroneal and gastrocnemius veins when visible. The superficial great saphenous vein was also interrogated. Spectral Doppler was utilized to evaluate flow at rest and with distal augmentation maneuvers in the common femoral, femoral and popliteal veins. COMPARISON:  None. FINDINGS: Contralateral Common Femoral Vein: Respiratory phasicity is normal and symmetric with the  symptomatic side. No evidence of thrombus. Normal compressibility. Common Femoral Vein: No evidence of thrombus. Normal compressibility, respiratory phasicity and response to augmentation. Saphenofemoral Junction: No evidence of thrombus. Normal compressibility and flow on color Doppler imaging. Profunda Femoral Vein: No evidence of thrombus. Normal compressibility and flow on color Doppler imaging. Femoral Vein: No evidence of thrombus. Normal compressibility, respiratory phasicity and response to augmentation. Popliteal Vein: No evidence of thrombus. Normal compressibility, respiratory phasicity and response to augmentation. Calf Veins: No evidence of thrombus. Normal compressibility and flow on color Doppler imaging. Superficial Great Saphenous Vein: No evidence of thrombus. Normal compressibility. Venous Reflux:  None. Other Findings:  None. IMPRESSION: No evidence of deep venous thrombosis in the right lower extremity. Electronically Signed   By: Ilona Sorrel M.D.   On: 04/11/2017 19:54   Dg Foot Complete Right  Result Date: 04/11/2017 CLINICAL DATA:  Right foot pain and swelling for 1 week. EXAM: RIGHT FOOT COMPLETE - 3+ VIEW COMPARISON:  None. FINDINGS: Mild midfoot and forefoot degenerative changes. Bony densities near the second MTP joint could be related to prior trauma with heterotopic ossification. No acute fractures identified. Moderate spurring changes noted at the Achilles tendon attachment. Os trigonum is noted. Bony density near the medial malleolus could be due to an old avulsion injury. IMPRESSION: No acute fracture.  Evidence of remote trauma. Electronically Signed   By: Marijo Sanes M.D.   On: 04/11/2017 19:28    ____________________________________________    PROCEDURES  Procedure(s) performed:    Procedures    Medications  predniSONE (DELTASONE) tablet 60 mg (60 mg Oral Given 04/11/17 2047)     ____________________________________________   INITIAL IMPRESSION /  ASSESSMENT AND PLAN / ED COURSE  Pertinent labs & imaging results that were available during my care of the patient were reviewed by me and considered in my medical decision making (see chart for details).  Review of the [place] CSRS was performed in accordance of the [place] prior to dispensing any controlled drugs.     Assessment and plan Gout Differential diagnosis included gout, DVT, cellulitis and tinea pedis.  Venous ultrasound of the right lower extremity was noncontributory for thromboembolism.  X-ray examination of the right foot revealed no acute fractures or bony abnormalities.  Physical exam findings were not  consistent with cellulitis.  Elevated uric acid levels are consistent with gout.  Patient was discharged with a 5-day course of prednisone given risk for GI bleed with indomethacin.  Vital signs are reassuring prior to discharge.  All patient questions were answered.    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 Despite best efforts to proofread, errors can occur which can change the meaning. Any change was purely unintentional.    Karren Cobble 04/11/17 2109    Nance Pear, MD 04/11/17 2134

## 2017-09-12 DIAGNOSIS — C4491 Basal cell carcinoma of skin, unspecified: Secondary | ICD-10-CM

## 2017-09-12 HISTORY — DX: Basal cell carcinoma of skin, unspecified: C44.91

## 2017-10-14 ENCOUNTER — Emergency Department (HOSPITAL_COMMUNITY): Payer: Medicare Other

## 2017-10-14 ENCOUNTER — Encounter (HOSPITAL_COMMUNITY): Payer: Self-pay

## 2017-10-14 ENCOUNTER — Emergency Department (HOSPITAL_COMMUNITY)
Admission: EM | Admit: 2017-10-14 | Discharge: 2017-10-14 | Disposition: A | Discharge status: Home / Self Care | Payer: Medicare Other | Attending: Emergency Medicine | Admitting: Emergency Medicine

## 2017-10-14 DIAGNOSIS — I2510 Atherosclerotic heart disease of native coronary artery without angina pectoris: Secondary | ICD-10-CM | POA: Insufficient documentation

## 2017-10-14 DIAGNOSIS — Z7984 Long term (current) use of oral hypoglycemic drugs: Secondary | ICD-10-CM | POA: Diagnosis not present

## 2017-10-14 DIAGNOSIS — L509 Urticaria, unspecified: Secondary | ICD-10-CM

## 2017-10-14 DIAGNOSIS — T7840XA Allergy, unspecified, initial encounter: Secondary | ICD-10-CM | POA: Diagnosis not present

## 2017-10-14 DIAGNOSIS — Z79899 Other long term (current) drug therapy: Secondary | ICD-10-CM | POA: Diagnosis not present

## 2017-10-14 DIAGNOSIS — Z7982 Long term (current) use of aspirin: Secondary | ICD-10-CM | POA: Insufficient documentation

## 2017-10-14 DIAGNOSIS — E119 Type 2 diabetes mellitus without complications: Secondary | ICD-10-CM | POA: Insufficient documentation

## 2017-10-14 DIAGNOSIS — Z87891 Personal history of nicotine dependence: Secondary | ICD-10-CM | POA: Insufficient documentation

## 2017-10-14 DIAGNOSIS — I10 Essential (primary) hypertension: Secondary | ICD-10-CM | POA: Insufficient documentation

## 2017-10-14 DIAGNOSIS — E785 Hyperlipidemia, unspecified: Secondary | ICD-10-CM | POA: Diagnosis not present

## 2017-10-14 LAB — CBC
HCT: 48.2 % (ref 39.0–52.0)
Hemoglobin: 16.7 g/dL (ref 13.0–17.0)
MCH: 32.8 pg (ref 26.0–34.0)
MCHC: 34.6 g/dL (ref 30.0–36.0)
MCV: 94.7 fL (ref 78.0–100.0)
PLATELETS: 175 10*3/uL (ref 150–400)
RBC: 5.09 MIL/uL (ref 4.22–5.81)
RDW: 13.4 % (ref 11.5–15.5)
WBC: 8.6 10*3/uL (ref 4.0–10.5)

## 2017-10-14 LAB — BASIC METABOLIC PANEL
Anion gap: 11 (ref 5–15)
BUN: 22 mg/dL (ref 8–23)
CHLORIDE: 103 mmol/L (ref 98–111)
CO2: 23 mmol/L (ref 22–32)
CREATININE: 1.74 mg/dL — AB (ref 0.61–1.24)
Calcium: 9 mg/dL (ref 8.9–10.3)
GFR calc Af Amer: 42 mL/min — ABNORMAL LOW (ref >60)
GFR calc non Af Amer: 37 mL/min — ABNORMAL LOW (ref >60)
Glucose, Bld: 175 mg/dL — ABNORMAL HIGH (ref 70–99)
Potassium: 3.6 mmol/L (ref 3.5–5.1)
Sodium: 137 mmol/L (ref 135–145)

## 2017-10-14 LAB — TROPONIN I: Troponin I: <0.03 ng/mL (ref <0.03)

## 2017-10-14 MED ORDER — ALUM & MAG HYDROXIDE-SIMETH 200-200-20 MG/5ML PO SUSP
30.0000 mL | Freq: Once | ORAL | Status: AC
  Administered 2017-10-14: 30 mL via ORAL
  Filled 2017-10-14: qty 30

## 2017-10-14 NOTE — ED Notes (Signed)
Pt states chest pain is much better and is now pain free

## 2017-10-14 NOTE — ED Provider Notes (Signed)
[facility] EMERGENCY DEPARTMENT Provider Note   CSN: [id number] Arrival date & time: 10/14/17  1956     History   Chief Complaint Chief Complaint  Patient presents with  . Allergic Reaction  . Chest Pain    HPI William Harrington is a 77 y.o. male.  HPI   He complains of a rash which started around his waist earlier today and looked like "hives."  He Took Benadryl and the rash has almost resolved.  He also noticed some tightness in his chest today.  He denies cough, fever, shortness of breath, nausea, vomiting, focal weakness or paresthesia.  There are no other known modifying factors.   Past Medical History:  Diagnosis Date  . Anemia yrs ago  . Arthritis   . Coronary artery disease   . Diabetes mellitus   . GERD (gastroesophageal reflux disease)   . Hyperlipidemia   . Hypertension   . Renal insufficiency    creatinine 1.5 baseline   . Sleep apnea    STOPBANG=5    Patient Active Problem List   Diagnosis Date Noted  . 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 CARDIOVASCULAR STRESS TEST  06-01-2010   normal nuclear stress test per dr Ubaldo Glassing. last office visit note dr Tommye Standard 02-01-2011 on chart  . cataract extraction right eye  04-2011  . colonscopy  5 yrs ago  . EYE SURGERY  04-2010   macular hole surgery, right eye  . HEMORRHOID SURGERY  yrs ago  . TOTAL HIP ARTHROPLASTY  07/01/2011   Procedure: TOTAL HIP ARTHROPLASTY ANTERIOR APPROACH;  Surgeon: Mauri Pole, MD;  Location: [facility];  Service: Orthopedics;  Laterality: Left;        Home Medications    Prior to Admission medications   Medication Sig Start Date End Date Taking? Authorizing Provider  allopurinol (ZYLOPRIM) 100 MG tablet Take 100 mg by mouth daily. 07/25/17  Yes [provider]  aspirin EC 81 MG tablet Take 81 mg by mouth daily.   Yes [provider]  diphenhydrAMINE (BENADRYL) 25 mg capsule Take 1 capsule (25 mg total) by mouth every 6  (six) hours as needed for itching, allergies or sleep. 07/02/11 10/14/17 Yes Babish, Rodman Key, PA-C  fish oil-omega-3 fatty acids 1000 MG capsule Take 1 g by mouth 2 (two) times daily.    Yes [provider]  glipiZIDE (GLUCOTROL) 5 MG tablet Take 2.5 mg by mouth every evening.    Yes [provider]  hydrochlorothiazide (HYDRODIURIL) 25 MG tablet Take 25 mg by mouth daily with breakfast.    Yes [provider]  lisinopril (PRINIVIL,ZESTRIL) 40 MG tablet Take 40 mg by mouth daily with breakfast.   Yes [provider]  metoprolol (LOPRESSOR) 100 MG tablet Take 100 mg by mouth daily with breakfast.   Yes [provider]  omeprazole (PRILOSEC) 20 MG capsule Take 20 mg by mouth daily with breakfast.    Yes [provider]  Semaglutide (OZEMPIC) 0.25 or 0.5 MG/DOSE SOPN Inject 0.5 mg into the skin every Sunday.   Yes [provider]  simvastatin (ZOCOR) 80 MG tablet Take 40 mg by mouth at bedtime. Patient takes 1/2 tablet by mouth at bedtime (40mg )   Yes [provider]  vitamin E (VITAMIN E) 400 UNIT capsule Take 400 Units by mouth daily.   Yes [provider]    Family History History reviewed. No pertinent family history.  Social  History Social History   Tobacco Use  . Smoking status: Former Smoker    Packs/day: 2.00    Years: 41.00    Pack years: 82.00    Types: Cigarettes    Last attempt to quit: 02/23/1995    Years since quitting: 22.6  . Smokeless tobacco: Never Used  Substance Use Topics  . Alcohol use: Yes    Comment:  wine 3-4 nights week  . Drug use: No     Allergies   Tetracyclines & related   Review of Systems Review of Systems  All other systems reviewed and are negative.    Physical Exam Updated Vital Signs BP 128/86   Pulse 80   Temp (!) 97.1 F (36.2 C) (Oral)   Resp (!) 22   Ht 5' 7.5" (1.715 m)   Wt 88.9 kg   SpO2 97%   BMI 30.24 kg/m   Physical Exam  Constitutional: He  is oriented to person, place, and time. He appears well-developed and well-nourished.  HENT:  Head: Normocephalic and atraumatic.  Right Ear: External ear normal.  Left Ear: External ear normal.  No oral angioedema.  Eyes: Pupils are equal, round, and reactive to light. Conjunctivae and EOM are normal.  Neck: Normal range of motion and phonation normal. Neck supple.  Cardiovascular: Normal rate, regular rhythm and normal heart sounds.  Pulmonary/Chest: Effort normal and breath sounds normal. He exhibits no bony tenderness.  Abdominal: Soft. There is no tenderness.  Musculoskeletal: Normal range of motion.  Neurological: He is alert and oriented to person, place, and time. No cranial nerve deficit or sensory deficit. He exhibits normal muscle tone. Coordination normal.  Skin: Skin is warm, dry and intact.  Very vague urticarial rash present around the belt line anteriorly of the waist.  Psychiatric: He has a normal mood and affect. His behavior is normal. Judgment and thought content normal.  Nursing note and vitals reviewed.    ED Treatments / Results  Labs (all labs ordered are listed, but only abnormal results are displayed) Labs Reviewed  BASIC METABOLIC PANEL - Abnormal; Notable for the following components:      Result Value   Glucose, Bld 175 (*)    Creatinine, Ser 1.74 (*)    GFR calc non Af Amer 37 (*)    GFR calc Af Amer 42 (*)    All other components within normal limits  CBC  TROPONIN I    EKG EKG Interpretation  Date/Time:  Friday October 14 2017 20:03:55 EDT Ventricular Rate:  84 PR Interval:  170 QRS Duration: 88 QT Interval:  366 QTC Calculation: 432 R Axis:   -79 Text Interpretation:  Normal sinus rhythm Left anterior fascicular block Cannot rule out Anterior infarct , age undetermined Abnormal ECG since last tracing no significant change Confirmed by Daleen Bo [phone]) on 10/14/2017 9:23:18 PM   Radiology Dg Chest 2 View  Result Date:  10/14/2017 CLINICAL DATA:  Chest pain tonight. EXAM: CHEST - 2 VIEW COMPARISON:  03/08/2011 FINDINGS: Heart is normal in size. Aortic tortuosity with unchanged mediastinal contours. The lungs are clear. Pulmonary vasculature is normal. No consolidation, pleural effusion, or pneumothorax. No acute osseous abnormalities are seen. Degenerative change in the thoracic spine. IMPRESSION: No acute abnormality. Electronically Signed   By: Jeb Levering M.D.   On: 10/14/2017 21:47    Procedures Procedures (including critical care time)  Medications Ordered in ED Medications  alum & mag hydroxide-simeth (MAALOX/MYLANTA) 200-200-20 MG/5ML suspension 30 mL (30 mLs Oral  Given 10/14/17 2204)     Initial Impression / Assessment and Plan / ED Course  I have reviewed the triage vital signs and the nursing notes.  Pertinent labs & imaging results that were available during my care of the patient were reviewed by me and considered in my medical decision making (see chart for details).  Clinical Course as of Oct 16 1118  Fri Oct 14, 2017  2233 Normal  Troponin I [EW]  2233 Normal  CBC [EW]  2233 Normal except glucose high, creatinine high, GFR low  Basic metabolic panel(!) [EW]    Clinical Course User Index [EW] Daleen Bo, MD     Patient Vitals for the past 24 hrs:  BP Temp Temp src Pulse Resp SpO2 Height Weight  10/14/17 2230 128/86 - - 80 (!) 22 97 % - -  10/14/17 2200 (!) 156/99 - - 80 20 96 % - -  10/14/17 2130 (!) 131/100 - - 81 (!) 22 97 % - -  10/14/17 2100 (!) 155/91 - - 81 (!) 21 97 % - -  10/14/17 2045 - - - 81 20 98 % - -  10/14/17 2030 - - - 80 (!) 26 95 % - -  10/14/17 2009 - - - - - - 5' 7.5" (1.715 m) 88.9 kg  10/14/17 1959 (!) 169/94 (!) 97.1 F (36.2 C) Oral 88 16 93 % - -    10:35 PM Reevaluation with update and discussion. After initial assessment and treatment, an updated evaluation reveals he feels better now, the chest discomfort resolved after Maalox.  No  exacerbation of his rash.  Findings discussed questions answered. Daleen Bo   Medical Decision Making: Allergic reaction with hives, without anaphylaxis.  No evidence for etiology of hives.  Nonspecific chest discomfort improved with antacid.  ACS, PE or pneumonia.  CRITICAL CARE-no Performed by: Daleen Bo   Nursing Notes Reviewed/ Care Coordinated Applicable Imaging Reviewed Interpretation of Laboratory Data incorporated into ED treatment  The patient appears reasonably screened and/or stabilized for discharge and I doubt any other medical condition or other [facility] requiring further screening, evaluation, or treatment in the ED at this time prior to discharge.  Plan: Home Medications-continue routine medications, use Benadryl for itching; Home Treatments-rest, fluids; return here if the recommended treatment, does not improve the symptoms; Recommended follow up-PCP or return here as needed.     Final Clinical Impressions(s) / ED Diagnoses   Final diagnoses:  Allergic reaction, initial encounter  [place]    ED Discharge Orders    None       Daleen Bo, MD 10/15/17 1122

## 2017-10-14 NOTE — ED Triage Notes (Signed)
Patient states that he is breaking out all over and itching.  Also complaining of chest pain.  Only new medication is a B12 Tablet that he started on Monday.

## 2017-10-14 NOTE — Discharge Instructions (Addendum)
Continue to use an antihistamine either Benadryl or Zyrtec and as needed for ongoing hives.  Return here, if needed, for problems.

## 2017-10-14 NOTE — ED Notes (Signed)
Patient took 2 benadryl at home.

## 2018-01-29 ENCOUNTER — Emergency Department
Admission: EM | Admit: 2018-01-29 | Discharge: 2018-01-29 | Disposition: A | Discharge status: Home / Self Care | Payer: Medicare Other | Attending: Emergency Medicine | Admitting: Emergency Medicine

## 2018-01-29 ENCOUNTER — Emergency Department: Payer: Medicare Other

## 2018-01-29 ENCOUNTER — Encounter: Payer: Self-pay | Admitting: Emergency Medicine

## 2018-01-29 ENCOUNTER — Other Ambulatory Visit: Payer: Self-pay

## 2018-01-29 DIAGNOSIS — Z96642 Presence of left artificial hip joint: Secondary | ICD-10-CM | POA: Diagnosis not present

## 2018-01-29 DIAGNOSIS — I2510 Atherosclerotic heart disease of native coronary artery without angina pectoris: Secondary | ICD-10-CM | POA: Diagnosis not present

## 2018-01-29 DIAGNOSIS — I10 Essential (primary) hypertension: Secondary | ICD-10-CM | POA: Insufficient documentation

## 2018-01-29 DIAGNOSIS — Z79899 Other long term (current) drug therapy: Secondary | ICD-10-CM | POA: Diagnosis not present

## 2018-01-29 DIAGNOSIS — Y999 Unspecified external cause status: Secondary | ICD-10-CM | POA: Insufficient documentation

## 2018-01-29 DIAGNOSIS — Z7982 Long term (current) use of aspirin: Secondary | ICD-10-CM | POA: Diagnosis not present

## 2018-01-29 DIAGNOSIS — S46011A Strain of muscle(s) and tendon(s) of the rotator cuff of right shoulder, initial encounter: Secondary | ICD-10-CM | POA: Diagnosis not present

## 2018-01-29 DIAGNOSIS — W0110XA Fall on same level from slipping, tripping and stumbling with subsequent striking against unspecified object, initial encounter: Secondary | ICD-10-CM | POA: Insufficient documentation

## 2018-01-29 DIAGNOSIS — Y929 Unspecified place or not applicable: Secondary | ICD-10-CM | POA: Diagnosis not present

## 2018-01-29 DIAGNOSIS — Z87891 Personal history of nicotine dependence: Secondary | ICD-10-CM | POA: Diagnosis not present

## 2018-01-29 DIAGNOSIS — Y939 Activity, unspecified: Secondary | ICD-10-CM | POA: Diagnosis not present

## 2018-01-29 DIAGNOSIS — E119 Type 2 diabetes mellitus without complications: Secondary | ICD-10-CM | POA: Insufficient documentation

## 2018-01-29 DIAGNOSIS — S4991XA Unspecified injury of right shoulder and upper arm, initial encounter: Secondary | ICD-10-CM | POA: Diagnosis present

## 2018-01-29 MED ORDER — PREDNISONE 10 MG PO TABS: 10.0000 mg | ORAL_TABLET | Freq: Every day | ORAL | 0 refills | Status: DC

## 2018-01-29 NOTE — ED Triage Notes (Signed)
Patient reports falling backwards on Thursday and reached back trying to catch himself with right arm Reports no improvement of pain since fall. Complaining of pain to right upper arm.

## 2018-01-29 NOTE — ED Provider Notes (Signed)
[facility] Emergency Department Provider Note  ____________________________________________  Time seen: Approximately 3:04 PM  I have reviewed the triage vital signs and the nursing notes.   HISTORY  Chief Complaint Fall and Arm Pain    HPI William Harrington is a 77 y.o. male who presents the emergency department complaining of right upper arm pain.  Patient reports that approximately 4 days ago, he tripped and fell backwards.  Patient try to catch himself with an extended arm but had pain to the right anterior shoulder after the fall.  Patient initially states that he was able to move the arm, did not feel like it was "broken."  Patient has had ongoing pain to the anterior shoulder and was concerned that he may have sustained a fracture.  No radicular symptoms.  He did not hit his head or lose consciousness.  Patient's only complaint today is right shoulder pain.  No medications at home for this complaint  Patient does have a history of anemia, arthritis, coronary artery disease, diabetes, hypertension, renal insufficiency, sleep apnea.  No complaints with chronic medical problems.    Past Medical History:  Diagnosis Date  . Anemia yrs ago  . Arthritis   . Coronary artery disease   . Diabetes mellitus   . GERD (gastroesophageal reflux disease)   . Hyperlipidemia   . Hypertension   . Renal insufficiency    creatinine 1.5 baseline   . Sleep apnea    STOPBANG=5    Patient Active Problem List   Diagnosis Date Noted  . 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 CARDIOVASCULAR STRESS TEST  06-01-2010   normal nuclear stress test per dr Ubaldo Glassing. last office visit note dr Tommye Standard 02-01-2011 on chart  . cataract extraction right eye  04-2011  . colonscopy  5 yrs ago  . EYE SURGERY  04-2010   macular hole surgery, right eye  . HEMORRHOID SURGERY  yrs ago  . TOTAL HIP ARTHROPLASTY  07/01/2011   Procedure: TOTAL HIP  ARTHROPLASTY ANTERIOR APPROACH;  Surgeon: Mauri Pole, MD;  Location: [facility];  Service: Orthopedics;  Laterality: Left;    Prior to Admission medications   Medication Sig Start Date End Date Taking? Authorizing Provider  allopurinol (ZYLOPRIM) 100 MG tablet Take 100 mg by mouth daily. 07/25/17   [provider]  aspirin EC 81 MG tablet Take 81 mg by mouth daily.    [provider]  diphenhydrAMINE (BENADRYL) 25 mg capsule Take 1 capsule (25 mg total) by mouth every 6 (six) hours as needed for itching, allergies or sleep. 07/02/11 10/14/17  Danae Orleans, PA-C  fish oil-omega-3 fatty acids 1000 MG capsule Take 1 g by mouth 2 (two) times daily.     [provider]  glipiZIDE (GLUCOTROL) 5 MG tablet Take 2.5 mg by mouth every evening.     [provider]  hydrochlorothiazide (HYDRODIURIL) 25 MG tablet Take 25 mg by mouth daily with breakfast.     [provider]  lisinopril (PRINIVIL,ZESTRIL) 40 MG tablet Take 40 mg by mouth daily with breakfast.    [provider]  metoprolol (LOPRESSOR) 100 MG tablet Take 100 mg by mouth daily with breakfast.    [provider]  omeprazole (PRILOSEC) 20 MG capsule Take 20 mg by mouth daily with breakfast.     [provider]  predniSONE (DELTASONE) 10 MG tablet Take 1 tablet (10 mg total) by mouth daily.  01/29/18   Ilyana Manuele, Charline Bills, PA-C  Semaglutide (OZEMPIC) 0.25 or 0.5 MG/DOSE SOPN Inject 0.5 mg into the skin every Sunday.    [provider]  simvastatin (ZOCOR) 80 MG tablet Take 40 mg by mouth at bedtime. Patient takes 1/2 tablet by mouth at bedtime (40mg )    [provider]  vitamin E (VITAMIN E) 400 UNIT capsule Take 400 Units by mouth daily.    [provider]    Allergies Tetracyclines & related  No family history on file.  Social History Social History   Tobacco Use  . Smoking status: Former Smoker    Packs/day: 2.00    Years: 41.00    Pack  years: 82.00    Types: Cigarettes    Last attempt to quit: 02/23/1995    Years since quitting: 22.9  . Smokeless tobacco: Never Used  Substance Use Topics  . Alcohol use: Yes    Comment:  wine 3-4 nights week  . Drug use: No     Review of Systems  Constitutional: No fever/chills Eyes: No visual changes.  Cardiovascular: no chest pain. Respiratory: no cough. No SOB. Gastrointestinal: No abdominal pain.  No nausea, no vomiting.   Musculoskeletal: Right shoulder pain Skin: Negative for rash, abrasions, lacerations, ecchymosis. Neurological: Negative for headaches, focal weakness or numbness. 10-point ROS otherwise negative.  ____________________________________________   PHYSICAL EXAM:  VITAL SIGNS: ED Triage Vitals  Enc Vitals Group     BP 01/29/18 1419 (!) 169/100     Pulse Rate 01/29/18 1419 84     Resp 01/29/18 1419 18     Temp 01/29/18 1419 97.7 F (36.5 C)     Temp Source 01/29/18 1419 Oral     SpO2 01/29/18 1419 98 %     Weight 01/29/18 1420 200 lb (90.7 kg)     Height 01/29/18 1420 5\' 7"  (1.702 m)     Head Circumference --      Peak Flow --      Pain Score 01/29/18 1420 2     Pain Loc --      Pain Edu? --      Excl. in [place]? --      Constitutional: Alert and oriented. Well appearing and in no acute distress. Eyes: Conjunctivae are normal. PERRL. EOMI. Head: Atraumatic. Neck: No stridor.  No cervical spine tenderness to palpation.  Cardiovascular: Normal rate, regular rhythm. Normal S1 and S2.  Good peripheral circulation. Respiratory: Normal respiratory effort without tachypnea or retractions. Lungs CTAB. Good air entry to the bases with no decreased or absent breath sounds. Musculoskeletal: Full range of motion to all extremities. No gross deformities appreciated.  Visualization of the right shoulder and right upper arm reveals no gross deformity, edema, erythema, ecchymosis.  No lacerations or abrasions.  Patient is able to flex, extend, abduct and abduct his  shoulder, however all ranges of motion are approximately 45 degrees or less.  Patient is not able to elevate his arm to the level of his shoulder.  Radial pulse intact distally.  Sensation intact distally.  On exam, patient is tender to palpation over the acromioclavicular joint space and proximal humerus.  No palpable abnormality or deficits.  Positive empty can test, negative drop test. Neurologic:  Normal speech and language. No gross focal neurologic deficits are appreciated.  Skin:  Skin is warm, dry and intact. No rash noted. Psychiatric: Mood and affect are normal. Speech and behavior are normal. Patient exhibits appropriate insight and judgement.   ____________________________________________  LABS (all labs ordered are listed, but only abnormal results are displayed)  Labs Reviewed - No data to display ____________________________________________  EKG   ____________________________________________  RADIOLOGY I personally viewed and evaluated these images as part of my medical decision making, as well as reviewing the written report by the radiologist.  I concur with radiologist finding of no acute osseous abnormality to the proximal humerus or shoulder.  Dg Humerus Right  Result Date: 01/29/2018 CLINICAL DATA:  Fall, right arm pain EXAM: RIGHT HUMERUS - 2+ VIEW COMPARISON:  None. FINDINGS: No fracture or dislocation is seen. Mild degenerative changes of the glenohumeral and acromioclavicular joints. The visualized soft tissues are unremarkable. IMPRESSION: No fracture or dislocation is seen. Mild degenerative changes. Electronically Signed   By: Julian Hy M.D.   On: 01/29/2018 15:43    ____________________________________________    PROCEDURES  Procedure(s) performed:    Procedures    Medications - No data to display   ____________________________________________   INITIAL IMPRESSION / ASSESSMENT AND PLAN / ED COURSE  Pertinent labs & imaging results  that were available during my care of the patient were reviewed by me and considered in my medical decision making (see chart for details).  Review of the [place] CSRS was performed in accordance of the [place] prior to dispensing any controlled drugs.      Patient's diagnosis is consistent with rotator cuff strain.  Patient presents emergency department complaining of right anterior shoulder pain after a fall.  On exam, patient does have limited range of motion due to pain.  Positive empty can, negative drop off test.  Differential included fracture, dislocation, strain, tear of the rotator cuff.  On exam, symptoms are most consistent with rotator cuff strain versus partial tear.  Patient is given sling for comfort but instructed to continue range of motion to the shoulder to prevent adhesive capsulitis.  Patient will be placed on short course of prednisone for inflammation.  If symptoms do not improve, follow-up with orthopedics. Patient is given ED precautions to return to the ED for any worsening or new symptoms.     ____________________________________________  FINAL CLINICAL IMPRESSION(S) / ED DIAGNOSES  Final diagnoses:  Rotator cuff strain, right, initial encounter      NEW MEDICATIONS STARTED DURING THIS VISIT:  ED Discharge Orders         Ordered    predniSONE (DELTASONE) 10 MG tablet  Daily    Note to Pharmacy:  Take 6 pills x 2 days, 5 pills x 2 days, 4 pills x 2 days, 3 pills x 2 days, 2 pills x 2 days, and 1 pill x 2 days   01/29/18 1614              This chart was dictated using voice recognition software/Dragon. Despite best efforts to proofread, errors can occur which can change the meaning. Any change was purely unintentional.    Darletta Moll, PA-C 01/29/18 1616    Carrie Mew, MD 01/31/18 [phone]

## 2018-01-29 NOTE — ED Notes (Signed)
See triage note. Pt unable to lift arm laterally. Radial pulse 2+. Appropriate color/warmth.

## 2018-10-24 IMAGING — DX DG CHEST 2V
2 series · 2 of 2 positions shown · non-contrast
Comparison: 03/08/2011

CLINICAL DATA: Chest pain tonight.

EXAM:
CHEST - 2 VIEW

[chest pa]
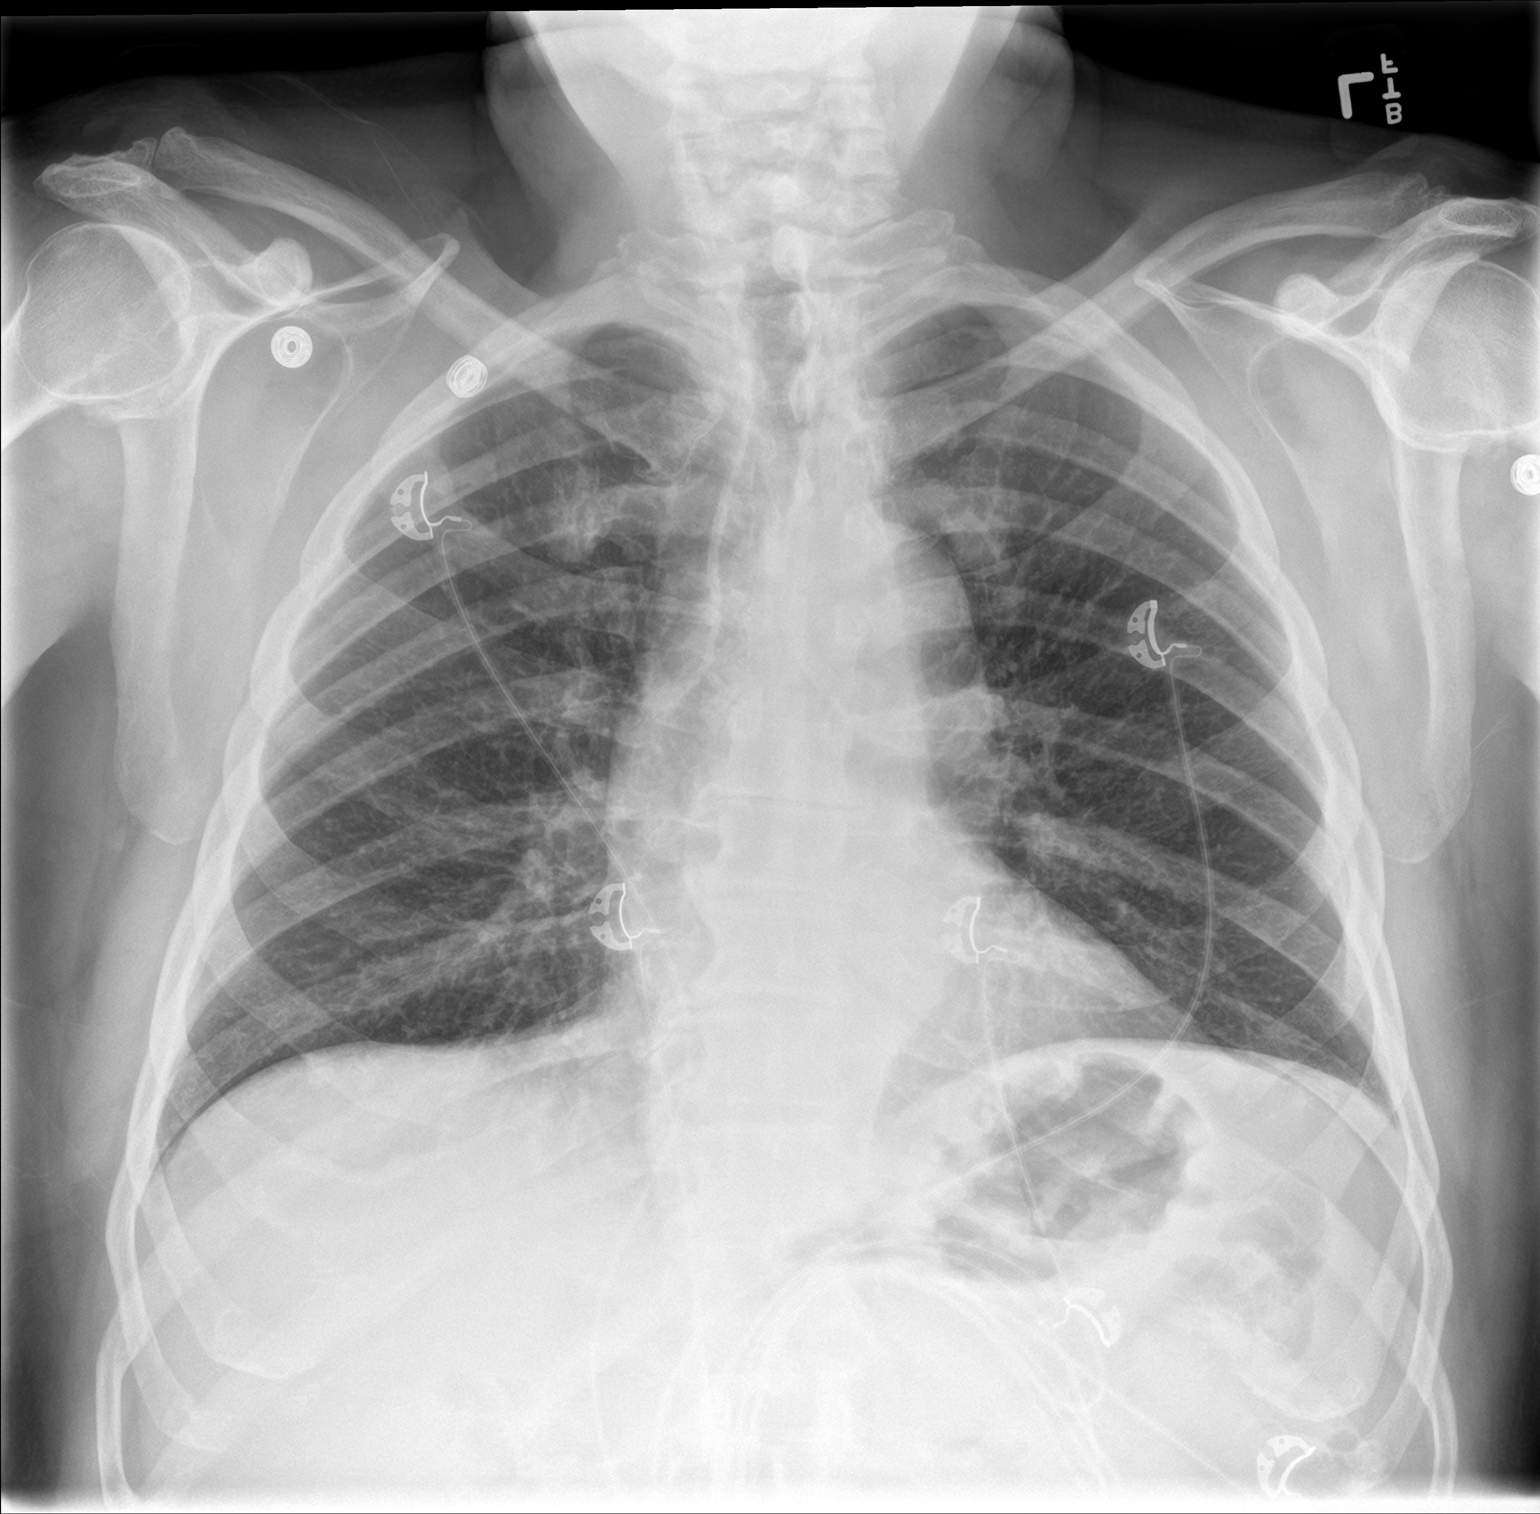

[chest lat]
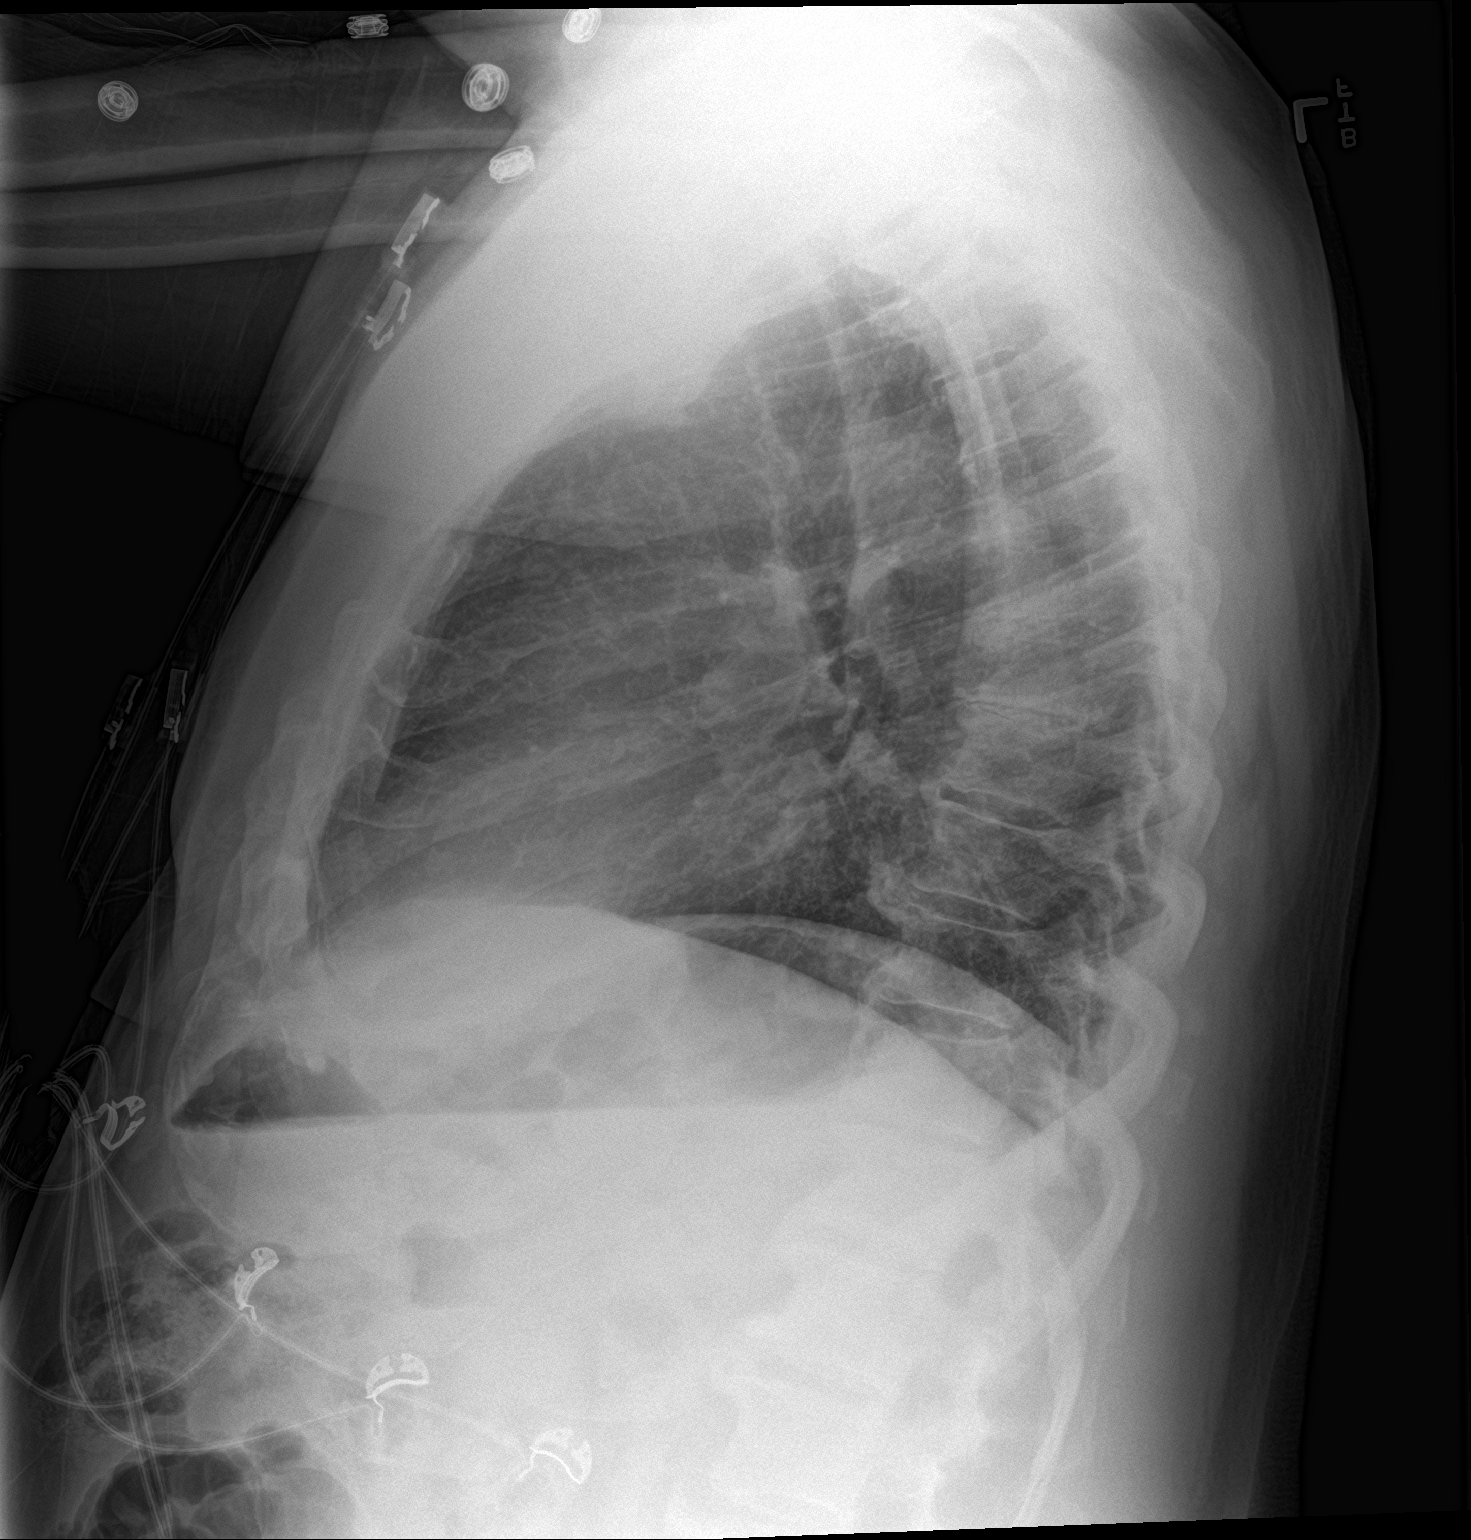

[2 of 2 positions shown; findings below may reference images not displayed]

FINDINGS: Heart is normal in size. Aortic tortuosity with unchanged
mediastinal contours. The lungs are clear. Pulmonary vasculature is
normal. No consolidation, pleural effusion, or pneumothorax. No
acute osseous abnormalities are seen. Degenerative change in the
thoracic spine.
IMPRESSION: No acute abnormality.

## 2019-08-12 ENCOUNTER — Emergency Department
Admission: EM | Admit: 2019-08-12 | Discharge: 2019-08-12 | Disposition: A | Discharge status: Home / Self Care | Payer: Medicare Other | Attending: Emergency Medicine | Admitting: Emergency Medicine

## 2019-08-12 ENCOUNTER — Emergency Department: Payer: Medicare Other

## 2019-08-12 ENCOUNTER — Other Ambulatory Visit: Payer: Self-pay

## 2019-08-12 DIAGNOSIS — Z87891 Personal history of nicotine dependence: Secondary | ICD-10-CM | POA: Insufficient documentation

## 2019-08-12 DIAGNOSIS — I2510 Atherosclerotic heart disease of native coronary artery without angina pectoris: Secondary | ICD-10-CM | POA: Diagnosis not present

## 2019-08-12 DIAGNOSIS — E119 Type 2 diabetes mellitus without complications: Secondary | ICD-10-CM | POA: Diagnosis not present

## 2019-08-12 DIAGNOSIS — Z7982 Long term (current) use of aspirin: Secondary | ICD-10-CM | POA: Diagnosis not present

## 2019-08-12 DIAGNOSIS — Z79899 Other long term (current) drug therapy: Secondary | ICD-10-CM | POA: Diagnosis not present

## 2019-08-12 DIAGNOSIS — Z7984 Long term (current) use of oral hypoglycemic drugs: Secondary | ICD-10-CM | POA: Insufficient documentation

## 2019-08-12 DIAGNOSIS — M7122 Synovial cyst of popliteal space [Baker], left knee: Secondary | ICD-10-CM | POA: Diagnosis not present

## 2019-08-12 DIAGNOSIS — I10 Essential (primary) hypertension: Secondary | ICD-10-CM | POA: Insufficient documentation

## 2019-08-12 DIAGNOSIS — M25562 Pain in left knee: Secondary | ICD-10-CM

## 2019-08-12 DIAGNOSIS — M79605 Pain in left leg: Secondary | ICD-10-CM | POA: Insufficient documentation

## 2019-08-12 DIAGNOSIS — R0602 Shortness of breath: Secondary | ICD-10-CM | POA: Insufficient documentation

## 2019-08-12 LAB — COMPREHENSIVE METABOLIC PANEL
ALT: 36 U/L (ref 0–44)
AST: 32 U/L (ref 15–41)
Albumin: 3.8 g/dL (ref 3.5–5.0)
Alkaline Phosphatase: 73 U/L (ref 38–126)
Anion gap: 9 (ref 5–15)
BUN: 31 mg/dL — ABNORMAL HIGH (ref 8–23)
CO2: 25 mmol/L (ref 22–32)
Calcium: 9.6 mg/dL (ref 8.9–10.3)
Chloride: 101 mmol/L (ref 98–111)
Creatinine, Ser: 1.82 mg/dL — ABNORMAL HIGH (ref 0.61–1.24)
GFR calc Af Amer: 41 mL/min — ABNORMAL LOW (ref >60)
GFR calc non Af Amer: 35 mL/min — ABNORMAL LOW (ref >60)
Glucose, Bld: 139 mg/dL — ABNORMAL HIGH (ref 70–99)
Potassium: 3.8 mmol/L (ref 3.5–5.1)
Sodium: 135 mmol/L (ref 135–145)
Total Bilirubin: 1 mg/dL (ref 0.3–1.2)
Total Protein: 6.6 g/dL (ref 6.5–8.1)

## 2019-08-12 LAB — CBC
HCT: 44.4 % (ref 39.0–52.0)
Hemoglobin: 15.8 g/dL (ref 13.0–17.0)
MCH: 33.2 pg (ref 26.0–34.0)
MCHC: 35.6 g/dL (ref 30.0–36.0)
MCV: 93.3 fL (ref 80.0–100.0)
Platelets: 169 10*3/uL (ref 150–400)
RBC: 4.76 MIL/uL (ref 4.22–5.81)
RDW: 12.9 % (ref 11.5–15.5)
WBC: 7.4 10*3/uL (ref 4.0–10.5)
nRBC: 0 % (ref 0.0–0.2)

## 2019-08-12 LAB — BRAIN NATRIURETIC PEPTIDE: B Natriuretic Peptide: 72.8 pg/mL (ref 0.0–100.0)

## 2019-08-12 LAB — FIBRIN DERIVATIVES D-DIMER (ARMC ONLY): Fibrin derivatives D-dimer (ARMC): 501.98 ng/mL (FEU) — ABNORMAL HIGH (ref 0.00–499.00)

## 2019-08-12 MED ORDER — NAPROXEN 500 MG PO TABS
500.0000 mg | ORAL_TABLET | Freq: Once | ORAL | Status: AC
  Administered 2019-08-12: 500 mg via ORAL
  Filled 2019-08-12: qty 1

## 2019-08-12 MED ORDER — NAPROXEN 500 MG PO TABS: 500.0000 mg | ORAL_TABLET | Freq: Two times a day (BID) | ORAL | 2 refills | Status: AC

## 2019-08-12 NOTE — ED Triage Notes (Signed)
Pt c/o leg pain and swelling since yesterday. No redness noted. Not hot to touch. Feels like R leg. Some edema noted. Bounces back quickly. States no fluid pill. No blood thinners.

## 2019-08-12 NOTE — ED Provider Notes (Signed)
[facility] Emergency Department Provider Note   ____________________________________________    I have reviewed the triage vital signs and the nursing notes.   HISTORY  Chief Complaint Leg pain    HPI William Harrington is a 79 y.o. male with a history as noted below who presents with complaints of left lateral knee pain which started several days ago but became worse the last 1 to 2 days.  He reports is very painful to walk on.  He denies fall or injury.  He thinks it may have happened after getting out of his truck.  Mild swelling described.  No redness no bruising.  No calf pain or swelling.   Past Medical History:  Diagnosis Date  . Anemia yrs ago  . Arthritis   . Coronary artery disease   . Diabetes mellitus   . GERD (gastroesophageal reflux disease)   . Hyperlipidemia   . Hypertension   . Renal insufficiency    creatinine 1.5 baseline   . Sleep apnea    STOPBANG=5    Patient Active Problem List   Diagnosis Date Noted  . 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 CARDIOVASCULAR STRESS TEST  06-01-2010   normal nuclear stress test per dr Ubaldo Glassing. last office visit note dr Tommye Standard 02-01-2011 on chart  . cataract extraction right eye  04-2011  . colonscopy  5 yrs ago  . EYE SURGERY  04-2010   macular hole surgery, right eye  . HEMORRHOID SURGERY  yrs ago  . TOTAL HIP ARTHROPLASTY  07/01/2011   Procedure: TOTAL HIP ARTHROPLASTY ANTERIOR APPROACH;  Surgeon: Mauri Pole, MD;  Location: [facility] ORS;  Service: Orthopedics;  Laterality: Left;    Prior to Admission medications   Medication Sig Start Date End Date Taking? Authorizing Provider  allopurinol (ZYLOPRIM) 100 MG tablet Take 100 mg by mouth daily. 07/25/17   [provider]  aspirin EC 81 MG tablet Take 81 mg by mouth daily.    [provider]  diphenhydrAMINE (BENADRYL) 25 mg capsule Take 1 capsule (25 mg total) by mouth every 6  (six) hours as needed for itching, allergies or sleep. 07/02/11 10/14/17  Danae Orleans, PA-C  fish oil-omega-3 fatty acids 1000 MG capsule Take 1 g by mouth 2 (two) times daily.     [provider]  glipiZIDE (GLUCOTROL) 5 MG tablet Take 2.5 mg by mouth every evening.     [provider]  hydrochlorothiazide (HYDRODIURIL) 25 MG tablet Take 25 mg by mouth daily with breakfast.     [provider]  lisinopril (PRINIVIL,ZESTRIL) 40 MG tablet Take 40 mg by mouth daily with breakfast.    [provider]  metoprolol (LOPRESSOR) 100 MG tablet Take 100 mg by mouth daily with breakfast.    [provider]  naproxen (NAPROSYN) 500 MG tablet Take 1 tablet (500 mg total) by mouth 2 (two) times daily with a meal. 08/12/19   Lavonia Drafts, MD  omeprazole (PRILOSEC) 20 MG capsule Take 20 mg by mouth daily with breakfast.     [provider]  predniSONE (DELTASONE) 10 MG tablet Take 1 tablet (10 mg total) by mouth daily. 01/29/18   Cuthriell, Charline Bills, PA-C  Semaglutide (OZEMPIC) 0.25 or 0.5 MG/DOSE SOPN Inject 0.5 mg into the skin every Sunday.    [provider]  simvastatin (ZOCOR) 80 MG tablet Take 40 mg by mouth at bedtime. Patient takes 1/2  tablet by mouth at bedtime (40mg )    [provider]  vitamin E (VITAMIN E) 400 UNIT capsule Take 400 Units by mouth daily.    [provider]     Allergies Tetracyclines & related  History reviewed. No pertinent family history.  Social History Social History   Tobacco Use  . Smoking status: Former Smoker    Packs/day: 2.00    Years: 41.00    Pack years: 82.00    Types: Cigarettes    Quit date: 02/23/1995    Years since quitting: 24.4  . Smokeless tobacco: Never Used  Substance Use Topics  . Alcohol use: Yes    Comment:  wine 3-4 nights week  . Drug use: No    Review of Systems  Constitutional: No fever/chills      Musculoskeletal: As above Skin: As  above Neurological: Negative for numbness or tingling    ____________________________________________   PHYSICAL EXAM:  VITAL SIGNS: ED Triage Vitals  Enc Vitals Group     BP 08/12/19 1302 (!) 159/96     Pulse Rate 08/12/19 1302 71     Resp 08/12/19 1302 16     Temp 08/12/19 1302 98.6 F (37 C)     Temp Source 08/12/19 1302 Oral     SpO2 08/12/19 1302 97 %     Weight 08/12/19 1303 90.7 kg (200 lb)     Height 08/12/19 1303 1.702 m (5\' 7" )     Head Circumference --      Peak Flow --      Pain Score 08/12/19 1303 0     Pain Loc --      Pain Edu? --      Excl. in [place]? --      Constitutional: Alert and oriented. No acute distress. Pleasant and interactive  Nose: No congestion/rhinnorhea. Mouth/Throat: Mucous membranes are moist.   Cardiovascular: Normal rate, regular rhythm.  Respiratory: Normal respiratory effort.  No retractions.  Musculoskeletal: Left knee: No anterior swelling, mild point tenderness palpation along the left lateral knee, no bruising or swelling.  Mild tenderness popliteal fossa, no swelling.  Calf normal.  Warm and well perfused Neurologic:  Normal speech and language. No gross focal neurologic deficits are appreciated.   Skin:  Skin is warm, dry and intact. No rash noted.   ____________________________________________   LABS (all labs ordered are listed, but only abnormal results are displayed)  Labs Reviewed  COMPREHENSIVE METABOLIC PANEL - Abnormal; Notable for the following components:      Result Value   Glucose, Bld 139 (*)    BUN 31 (*)    Creatinine, Ser 1.82 (*)    GFR calc non Af Amer 35 (*)    GFR calc Af Amer 41 (*)    All other components within normal limits  FIBRIN DERIVATIVES D-DIMER ([facility] ONLY) - Abnormal; Notable for the following components:   Fibrin derivatives D-dimer ([facility]) 501.98 (*)    All other components within normal limits  CBC  BRAIN NATRIURETIC PEPTIDE    ____________________________________________  EKG   ____________________________________________  RADIOLOGY  Ultrasound negative for DVT, left Baker's cyst noted ____________________________________________   PROCEDURES  Procedure(s) performed: No  Procedures   Critical Care performed: No ____________________________________________   INITIAL IMPRESSION / ASSESSMENT AND PLAN / ED COURSE  Pertinent labs & imaging results that were available during my care of the patient were reviewed by me and considered in my medical decision making (see chart for details).  Patient with left  knee pain as described above.  Ultrasound negative for DVT.  Differential includes ligamentous injury versus Baker's cyst is seen on ultrasound.  Recommend NSAIDs, ice, will give knee immobilizer, close follow-up with orthopedics   ____________________________________________   FINAL CLINICAL IMPRESSION(S) / ED DIAGNOSES  Final diagnoses:  Acute pain of left knee  Baker's cyst of knee, left      NEW MEDICATIONS STARTED DURING THIS VISIT:  Discharge Medication List as of 08/12/2019  4:14 PM    START taking these medications   Details  naproxen (NAPROSYN) 500 MG tablet Take 1 tablet (500 mg total) by mouth 2 (two) times daily with a meal., Starting Sun 08/12/2019, Normal         Note:  This document was prepared using Dragon voice recognition software and may include unintentional dictation errors.   Lavonia Drafts, MD 08/12/19 [phone]

## 2019-10-25 ENCOUNTER — Ambulatory Visit (INDEPENDENT_AMBULATORY_CARE_PROVIDER_SITE_OTHER): Payer: Medicare Other | Admitting: Dermatology

## 2019-10-25 ENCOUNTER — Other Ambulatory Visit: Payer: Self-pay

## 2019-10-25 DIAGNOSIS — L578 Other skin changes due to chronic exposure to nonionizing radiation: Secondary | ICD-10-CM | POA: Diagnosis not present

## 2019-10-25 DIAGNOSIS — L570 Actinic keratosis: Secondary | ICD-10-CM

## 2019-10-25 DIAGNOSIS — L821 Other seborrheic keratosis: Secondary | ICD-10-CM

## 2019-10-25 NOTE — Progress Notes (Signed)
   Follow-Up Visit   Subjective  William Harrington is a 79 y.o. male who presents for the following: sore, non healing (R jaw/preauricular, ~ 54yr).  The following portions of the chart were reviewed this encounter and updated as appropriate:  Tobacco  Allergies  Meds  Problems  Med Hx  Surg Hx  Fam Hx     Review of Systems:  No other skin or systemic complaints except as noted in HPI or Assessment and Plan.  Objective  Well appearing patient in no apparent distress; mood and affect are within normal limits.  A focused examination was performed including face. Relevant physical exam findings are noted in the Assessment and Plan.  Objective  R cheek, 2.0cm anterior to earlobe x 1: Pink scaly macules    Assessment & Plan   Actinic Damage - diffuse scaly erythematous macules with underlying dyspigmentation - Recommend daily broad spectrum sunscreen SPF 30+ to sun-exposed areas, reapply every 2 hours as needed.  - Call for new or changing lesions.  Seborrheic Keratoses - Stuck-on, waxy, tan-brown papules and plaques  - Discussed benign etiology and prognosis. - Observe - Call for any changes  AK (actinic keratosis) R cheek, 2.0cm anterior to earlobe x 1  Recheck in 2-3 months  Destruction of lesion - R cheek, 2.0cm anterior to earlobe x 1 Complexity: simple   Destruction method: cryotherapy   Informed consent: discussed and consent obtained   Timeout:  patient name, date of birth, surgical site, and procedure verified Lesion destroyed using liquid nitrogen: Yes   Region frozen until ice ball extended beyond lesion: Yes   Outcome: patient tolerated procedure well with no complications   Post-procedure details: wound care instructions given    Return in about 2 months (around 12/25/2019) for AK f/u.   I, Othelia Pulling, RMA, am acting as scribe for Sarina Ser, MD .  Documentation: I have reviewed the above documentation for accuracy and completeness, and I agree  with the above.  Sarina Ser, MD

## 2019-10-31 ENCOUNTER — Encounter: Payer: Self-pay | Admitting: Dermatology

## 2019-12-27 ENCOUNTER — Ambulatory Visit: Payer: Medicare Other | Admitting: Dermatology

## 2020-06-17 ENCOUNTER — Ambulatory Visit (INDEPENDENT_AMBULATORY_CARE_PROVIDER_SITE_OTHER): Payer: Medicare Other | Admitting: Dermatology

## 2020-06-17 ENCOUNTER — Other Ambulatory Visit: Payer: Self-pay

## 2020-06-17 ENCOUNTER — Ambulatory Visit: Payer: Medicare Other | Admitting: Dermatology

## 2020-06-17 ENCOUNTER — Encounter: Payer: Self-pay | Admitting: Dermatology

## 2020-06-17 DIAGNOSIS — L578 Other skin changes due to chronic exposure to nonionizing radiation: Secondary | ICD-10-CM

## 2020-06-17 DIAGNOSIS — L820 Inflamed seborrheic keratosis: Secondary | ICD-10-CM

## 2020-06-17 DIAGNOSIS — I781 Nevus, non-neoplastic: Secondary | ICD-10-CM | POA: Diagnosis not present

## 2020-06-17 DIAGNOSIS — D485 Neoplasm of uncertain behavior of skin: Secondary | ICD-10-CM

## 2020-06-17 DIAGNOSIS — Z85828 Personal history of other malignant neoplasm of skin: Secondary | ICD-10-CM

## 2020-06-17 NOTE — Progress Notes (Signed)
Follow-Up Visit   Subjective  William Harrington is a 80 y.o. male who presents for the following: recheck BCC site (Of the nose - started bleeding about 2 weeks ago, pt is concerned and would like it checked today ) and lesion (On the temple/hairline area - hits it when he combs his hair).  The following portions of the chart were reviewed this encounter and updated as appropriate:   Tobacco  Allergies  Meds  Problems  Med Hx  Surg Hx  Fam Hx     Review of Systems:  No other skin or systemic complaints except as noted in HPI or Assessment and Plan.  Objective  Well appearing patient in no apparent distress; mood and affect are within normal limits.  A focused examination was performed including the face. Relevant physical exam findings are noted in the Assessment and Plan.  Objective  L face/head x 17 (17): Erythematous keratotic or waxy stuck-on papule or plaque.   Objective  L nasal tip: Dilated blood vessels  Objective  L temple in the hairline: Cutaneous horn 0.6 cm   Assessment & Plan  Inflamed seborrheic keratosis (17) L face/head x 17  Destruction of lesion - L face/head x 17 Complexity: simple   Destruction method: cryotherapy   Informed consent: discussed and consent obtained   Timeout:  patient name, date of birth, surgical site, and procedure verified Lesion destroyed using liquid nitrogen: Yes   Region frozen until ice ball extended beyond lesion: Yes   Outcome: patient tolerated procedure well with no complications   Post-procedure details: wound care instructions given    Telangiectasia L nasal tip Benign-appearing.  Observation.  Call clinic for new or changing lesions.  Recommend daily use of broad spectrum spf 30+ sunscreen to sun-exposed areas.   Neoplasm of uncertain behavior of skin L temple in the hairline Epidermal / dermal shaving  Lesion diameter (cm):  0.6 Informed consent: discussed and consent obtained   Timeout: patient name, date  of birth, surgical site, and procedure verified   Procedure prep:  Patient was prepped and draped in usual sterile fashion Prep type:  Isopropyl alcohol Anesthesia: the lesion was anesthetized in a standard fashion   Anesthetic:  1% lidocaine w/ epinephrine 1-100,000 buffered w/ 8.4% NaHCO3 Instrument used: flexible razor blade   Hemostasis achieved with: pressure, aluminum chloride and electrodesiccation   Outcome: patient tolerated procedure well   Post-procedure details: sterile dressing applied and wound care instructions given   Dressing type: bandage and petrolatum    Destruction of lesion Complexity: extensive   Destruction method: electrodesiccation and curettage   Informed consent: discussed and consent obtained   Timeout:  patient name, date of birth, surgical site, and procedure verified Procedure prep:  Patient was prepped and draped in usual sterile fashion Prep type:  Isopropyl alcohol Anesthesia: the lesion was anesthetized in a standard fashion   Anesthetic:  1% lidocaine w/ epinephrine 1-100,000 buffered w/ 8.4% NaHCO3 Curettage performed in three different directions: Yes   Electrodesiccation performed over the curetted area: Yes   Lesion length (cm):  0.6 Lesion width (cm):  0.6 Margin per side (cm):  0.2 Final wound size (cm):  1 Hemostasis achieved with:  pressure, aluminum chloride and electrodesiccation Outcome: patient tolerated procedure well with no complications   Post-procedure details: sterile dressing applied and wound care instructions given   Dressing type: bandage and petrolatum    Specimen 1 - Surgical pathology Differential Diagnosis: D48.5 r/o SCC vs other  ED&C  today  Check Margins: No Cutaneous horn 0.6 cm  History of Basal Cell 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  - Call if any new or changing lesions are noted between office visits  Actinic Damage - chronic, secondary to cumulative UV radiation exposure/sun exposure over time - diffuse scaly erythematous macules with underlying dyspigmentation - Recommend daily broad spectrum sunscreen SPF 30+ to sun-exposed areas, reapply every 2 hours as needed.  - Recommend staying in the shade or wearing long sleeves, sun glasses (UVA+UVB protection) and wide brim hats (4-inch brim around the entire circumference of the hat). - Call for new or changing lesions.  Return in about 5 months (around 11/17/2020) for TBSE.  Luther Redo, CMA, am acting as scribe for Sarina Ser, MD .  Documentation: I have reviewed the above documentation for accuracy and completeness, and I agree with the above.  Sarina Ser, MD

## 2020-06-17 NOTE — Patient Instructions (Addendum)
If you have any questions or concerns for your doctor, please call our main line at [phone] and press option 4 to reach your doctor's medical assistant. If no one answers, please leave a voicemail as directed and we will return your call as soon as possible. Messages left after 4 pm will be answered the following business day.   You may also send us a message via MyChart. We typically respond to MyChart messages within 1-2 business days.  For prescription refills, please ask your pharmacy to contact our office. Our fax number is [phone].  If you have an urgent issue when the clinic is closed that cannot wait until the next business day, you can page your doctor at the number below.    Please note that while we do our best to be available for urgent issues outside of office hours, we are not available 24/7.   If you have an urgent issue and are unable to reach us, you may choose to seek medical care at your doctor's office, retail clinic, urgent care center, or emergency room.  If you have a medical emergency, please immediately call 911 or go to the emergency department.  Pager Numbers  - Dr. Kowalski: [phone]  - Dr. Moye: [phone]  - Dr. Stewart: [phone]  In the event of inclement weather, please call our main line at [phone] for an update on the status of any delays or closures.  Dermatology Medication Tips: Please keep the boxes that topical medications come in in order to help keep track of the instructions about where and how to use these. Pharmacies typically print the medication instructions only on the boxes and not directly on the medication tubes.   If your medication is too expensive, please contact our office at [phone] option 4 or send us a message through MyChart.   We are unable to tell what your co-pay for medications will be in advance as this is different depending on your insurance coverage. However, we may be able to find a  substitute medication at lower cost or fill out paperwork to get insurance to cover a needed medication.   If a prior authorization is required to get your medication covered by your insurance company, please allow us 1-2 business days to complete this process.  Drug prices often vary depending on where the prescription is filled and some pharmacies may offer cheaper prices.  The website www.goodrx.com contains coupons for medications through different pharmacies. The prices here do not account for what the cost may be with help from insurance (it may be cheaper with your insurance), but the website can give you the price if you did not use any insurance.  - You can print the associated coupon and take it with your prescription to the pharmacy.  - You may also stop by our office during regular business hours and pick up a GoodRx coupon card.  - If you need your prescription sent electronically to a different pharmacy, notify our office through [facility] MyChart or by phone at [phone] option 4.     Wound Care Instructions  1. Cleanse wound gently with soap and water once a day then pat dry with clean gauze. Apply a thing coat of Petrolatum (petroleum jelly, "Vaseline") over the wound (unless you have an allergy to this). We recommend that you use a new, sterile tube of Vaseline. Do not pick or remove scabs. Do not remove the yellow or white "healing tissue" from the base of the wound.    2. Cover the wound with fresh, clean, nonstick gauze and secure with paper tape. You may use Band-Aids in place of gauze and tape if the would is small enough, but would recommend trimming much of the tape off as there is often too much. Sometimes Band-Aids can irritate the skin.  3. You should call the office for your biopsy report after 1 week if you have not already been contacted.  4. If you experience any problems, such as abnormal amounts of bleeding, swelling, significant bruising, significant pain,  or evidence of infection, please call the office immediately.  5. FOR ADULT SURGERY PATIENTS: If you need something for pain relief you may take 1 extra strength Tylenol (acetaminophen) AND 2 Ibuprofen (200mg each) together every 4 hours as needed for pain. (do not take these if you are allergic to them or if you have a reason you should not take them.) Typically, you may only need pain medication for 1 to 3 days.     

## 2020-06-18 ENCOUNTER — Encounter: Payer: Self-pay | Admitting: Dermatology

## 2020-06-19 ENCOUNTER — Telehealth: Payer: Self-pay

## 2020-06-19 NOTE — Telephone Encounter (Signed)
Patient informed of pathology results 

## 2020-06-19 NOTE — Telephone Encounter (Signed)
-----   Message from Ralene Bathe, MD sent at 06/18/2020  5:13 PM EDT ----- Diagnosis Skin , left temple in the hairline VERRUCA VULGARIS, INFLAMED  Benign viral wart May recur No further treatment at this time.

## 2020-07-01 ENCOUNTER — Ambulatory Visit: Payer: Medicare Other | Admitting: Dermatology

## 2020-08-21 IMAGING — US US EXTREM LOW VENOUS*L*
1 series · 14 of 24 positions shown · non-contrast
Comparison: None.

CLINICAL DATA: Left lower extremity pain and swelling since
yesterday. Evaluate for DVT.

EXAM:
LEFT LOWER EXTREMITY VENOUS DOPPLER ULTRASOUND
TECHNIQUE: Gray-scale sonography with compression, as well as color and duplex
ultrasound, were performed to evaluate the deep venous system(s)
from the level of the common femoral vein through the popliteal and
proximal calf veins.

[Series 1: us venous img lower uni left (dvt) · portal-venous · 14 of 35 slices shown]
[im 1/35]
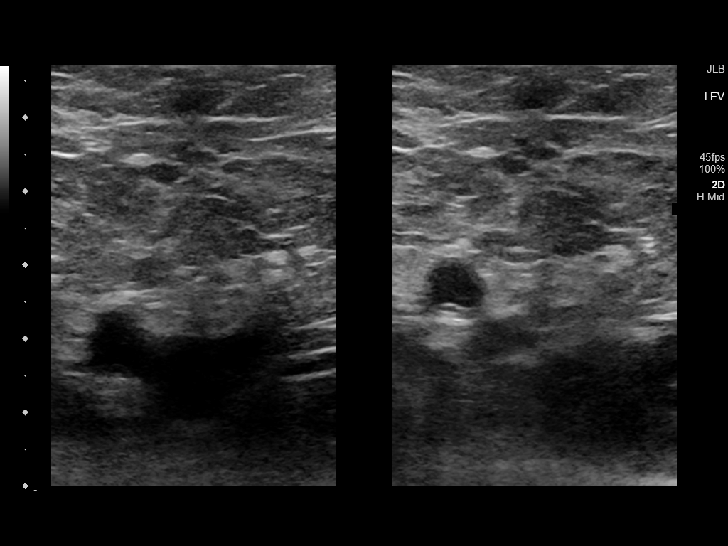
[im 3/35]
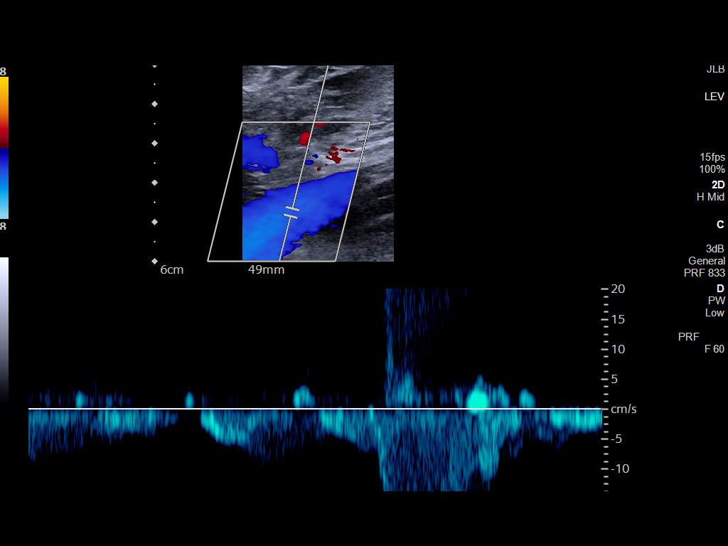
[im 6/35]
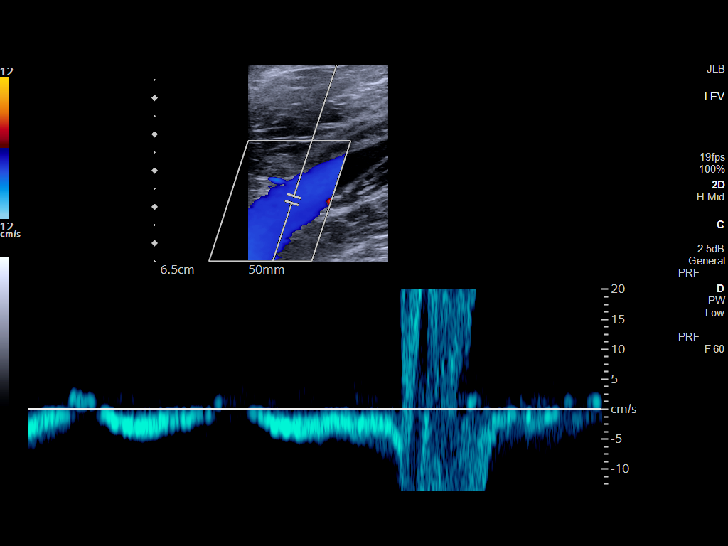
[im 9/35]
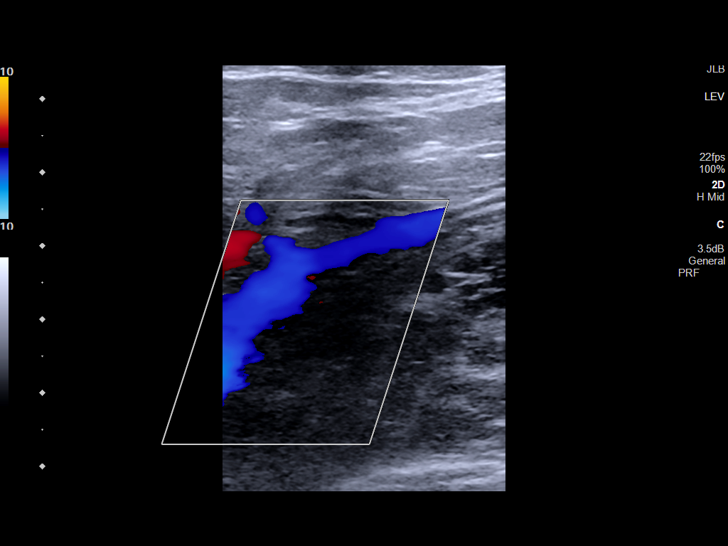
[im 11/35]
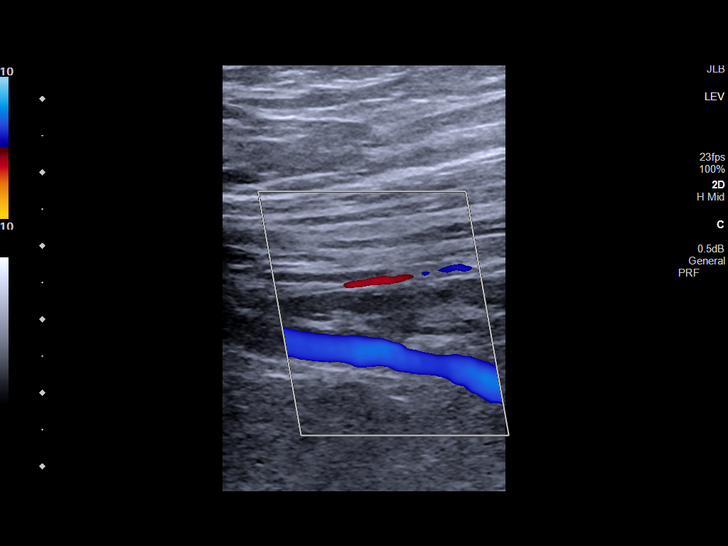
[im 14/35]
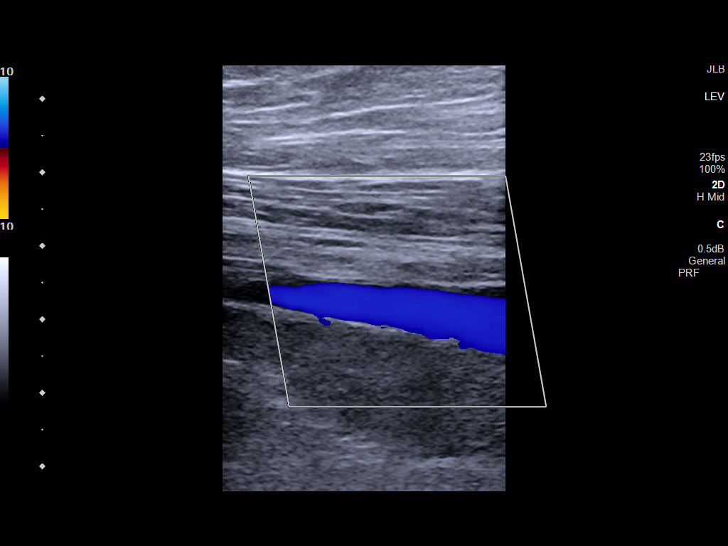
[im 17/35]
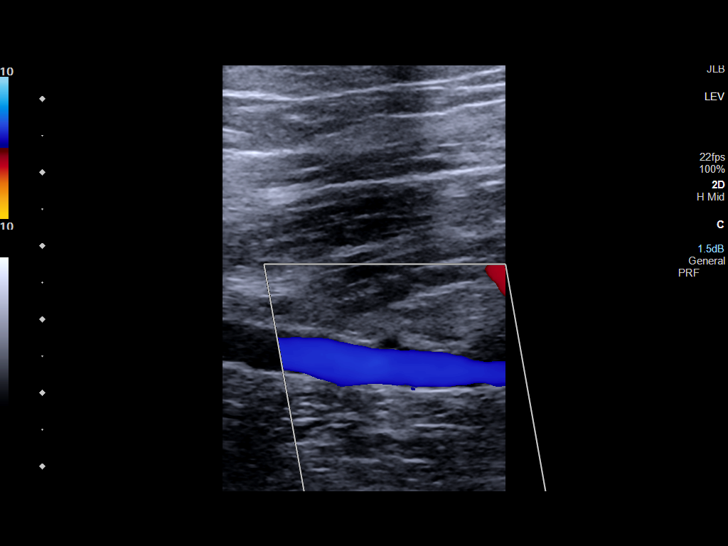
[im 18/35]
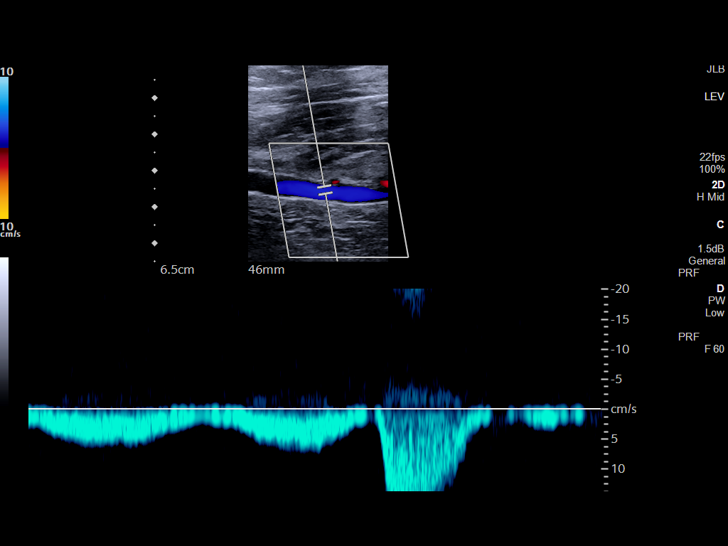
[im 21/35]
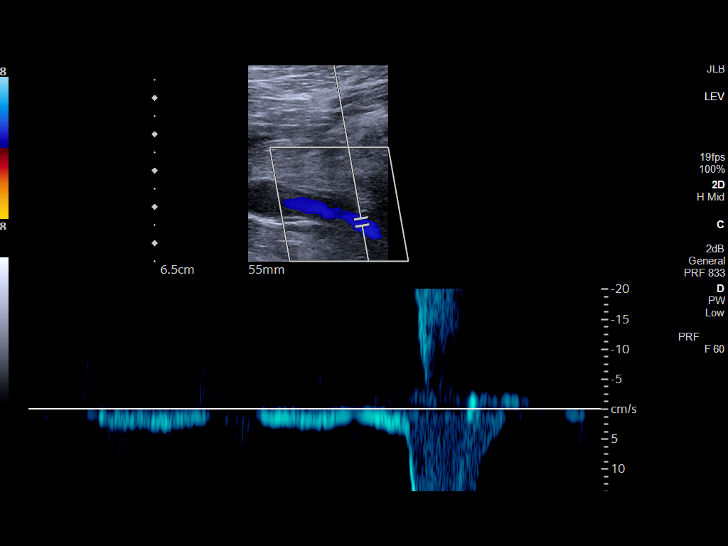
[im 24/35]
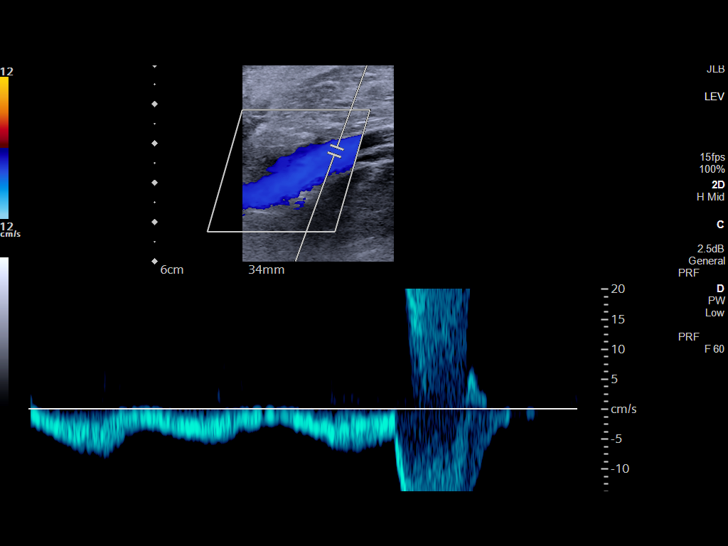
[im 27/35]
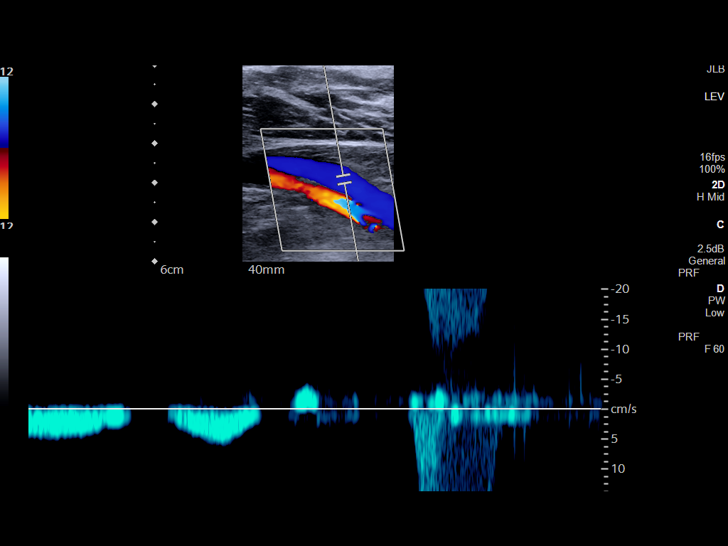
[im 29/35]
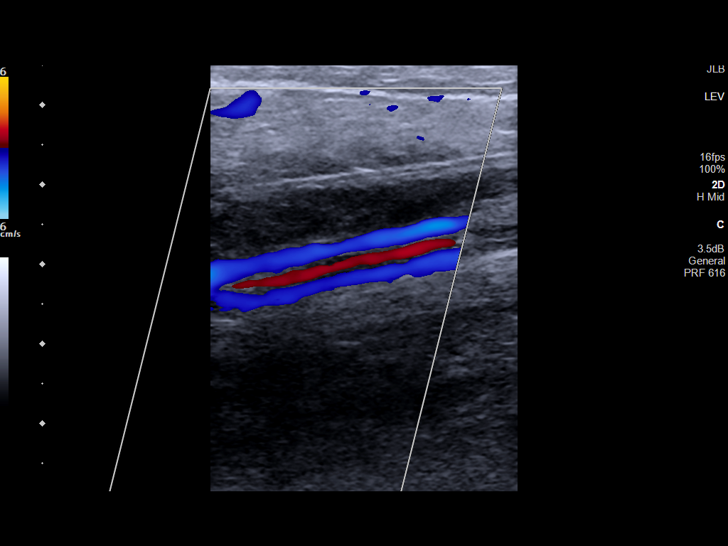
[im 32/35]
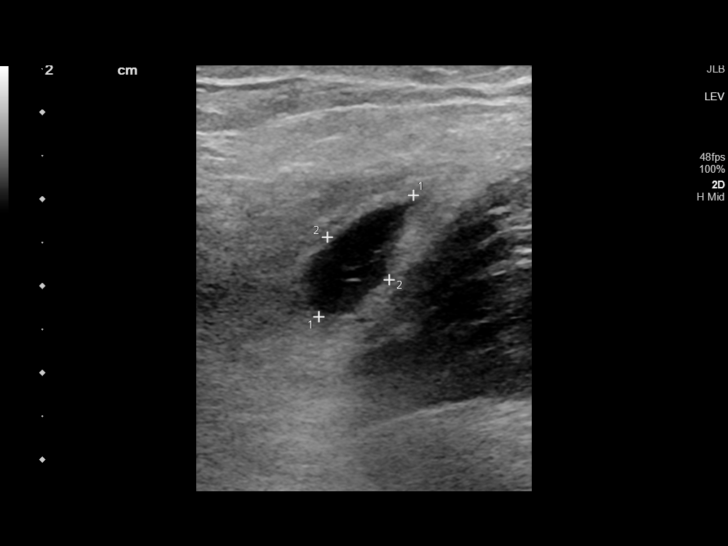
[im 35/35]
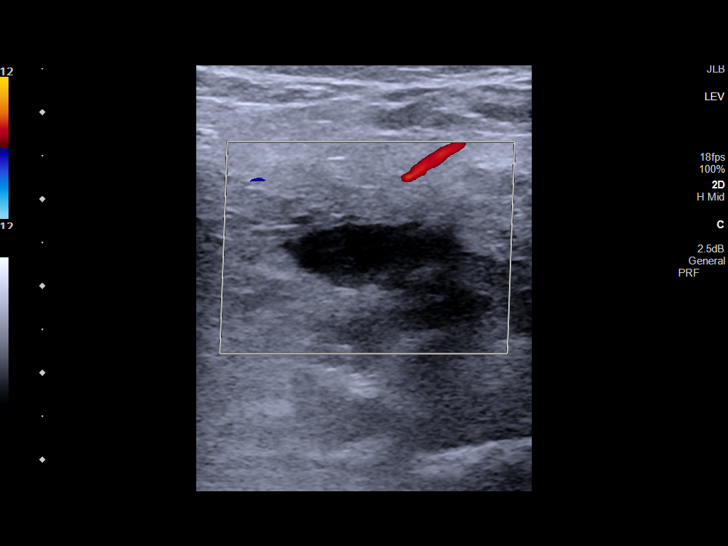

[14 of 24 positions shown; findings below may reference images not displayed]

FINDINGS: VENOUS

Normal compressibility of the common femoral, superficial femoral,
and popliteal veins, as well as the visualized calf veins.
Visualized portions of profunda femoral vein and great saphenous
vein unremarkable. No filling defects to suggest DVT on grayscale or
color Doppler imaging. Doppler waveforms show normal direction of
venous flow, normal respiratory plasticity and response to
augmentation.

Limited views of the contralateral common femoral vein are
unremarkable.

OTHER

2.1 cm Baker's cyst left popliteal fossa.

Limitations: none
IMPRESSION: 1.  No evidence of DVT within the left lower extremity.

2.  2.1 cm left-sided Baker's cyst.

## 2020-09-20 ENCOUNTER — Encounter (HOSPITAL_COMMUNITY): Payer: Self-pay | Admitting: Emergency Medicine

## 2020-09-20 ENCOUNTER — Emergency Department (HOSPITAL_COMMUNITY)
Admission: EM | Admit: 2020-09-20 | Discharge: 2020-09-20 | Disposition: A | Discharge status: Home / Self Care | Payer: Medicare Other | Attending: Emergency Medicine | Admitting: Emergency Medicine

## 2020-09-20 ENCOUNTER — Other Ambulatory Visit: Payer: Self-pay

## 2020-09-20 DIAGNOSIS — E119 Type 2 diabetes mellitus without complications: Secondary | ICD-10-CM | POA: Insufficient documentation

## 2020-09-20 DIAGNOSIS — Z87891 Personal history of nicotine dependence: Secondary | ICD-10-CM | POA: Insufficient documentation

## 2020-09-20 DIAGNOSIS — Z794 Long term (current) use of insulin: Secondary | ICD-10-CM | POA: Insufficient documentation

## 2020-09-20 DIAGNOSIS — Z79899 Other long term (current) drug therapy: Secondary | ICD-10-CM | POA: Insufficient documentation

## 2020-09-20 DIAGNOSIS — T783XXA Angioneurotic edema, initial encounter: Secondary | ICD-10-CM | POA: Insufficient documentation

## 2020-09-20 DIAGNOSIS — Z7984 Long term (current) use of oral hypoglycemic drugs: Secondary | ICD-10-CM | POA: Insufficient documentation

## 2020-09-20 DIAGNOSIS — I10 Essential (primary) hypertension: Secondary | ICD-10-CM | POA: Diagnosis not present

## 2020-09-20 DIAGNOSIS — Z7982 Long term (current) use of aspirin: Secondary | ICD-10-CM | POA: Diagnosis not present

## 2020-09-20 DIAGNOSIS — Z85828 Personal history of other malignant neoplasm of skin: Secondary | ICD-10-CM | POA: Diagnosis not present

## 2020-09-20 DIAGNOSIS — I2510 Atherosclerotic heart disease of native coronary artery without angina pectoris: Secondary | ICD-10-CM | POA: Insufficient documentation

## 2020-09-20 DIAGNOSIS — R21 Rash and other nonspecific skin eruption: Secondary | ICD-10-CM | POA: Diagnosis present

## 2020-09-20 DIAGNOSIS — Z96642 Presence of left artificial hip joint: Secondary | ICD-10-CM | POA: Diagnosis not present

## 2020-09-20 MED ORDER — FAMOTIDINE IN NACL 20-0.9 MG/50ML-% IV SOLN
20.0000 mg | Freq: Once | INTRAVENOUS | Status: AC
  Administered 2020-09-20: 20 mg via INTRAVENOUS
  Filled 2020-09-20: qty 50

## 2020-09-20 MED ORDER — PREDNISONE 20 MG PO TABS: 40.0000 mg | ORAL_TABLET | Freq: Every day | ORAL | 0 refills | Status: DC

## 2020-09-20 MED ORDER — METHYLPREDNISOLONE SODIUM SUCC 125 MG IJ SOLR
125.0000 mg | Freq: Once | INTRAMUSCULAR | Status: AC
  Administered 2020-09-20: 125 mg via INTRAVENOUS
  Filled 2020-09-20: qty 2

## 2020-09-20 MED ORDER — PREDNISONE 20 MG PO TABS: 40.0000 mg | ORAL_TABLET | Freq: Every day | ORAL | 0 refills | Status: AC

## 2020-09-20 NOTE — Discharge Instructions (Addendum)
Please stop taking your lisinopril as this is likely the cause of your tongue swelling. Call your doctor on Monday to discuss alternative medications. We will prescribe some prednisone to help make sure the swelling does not return. Be aware this may make your glucose and blood pressure more elevated than usual temporarily.

## 2020-09-20 NOTE — ED Triage Notes (Signed)
Pt come in for angioemdema. swollen tongue upon arrival. Daughter gave epipen at home due to wheezing. Benadryl 50 IM given in route. Pt arrived a/o. No wheezing upon arrival. Pt takes lisinopril and has been "for years".

## 2020-09-20 NOTE — ED Notes (Signed)
EDP at bedside. Pt states he can swallow but hard to do.

## 2020-09-20 NOTE — ED Provider Notes (Signed)
[facility] EMERGENCY DEPARTMENT Provider Note  CSN: [id number] Arrival date & time: 09/20/20 1814    History Chief Complaint  Patient presents with   Allergic Reaction    William Harrington is a 80 y.o. male with history of allergies has never been told what was causing them. He typically has rectal itching followed by a rash. He has been tested by an allergist without any thing being positive. Today he reports he began to have the rectal itching again and then his tongue began to swell. This was a new symptom for him. He has an epi-pen at home so his daughter in law gave him a dose of that and called EMS. He was given IM benadryl '50mg'$  enroute. He was reportedly wheezing at home but none on arrival. He denies rash today. He has been on lisinopril for 'a long time'.   Past Medical History:  Diagnosis Date   Anemia yrs ago   Arthritis    Basal cell carcinoma 09/12/2017   Left nasal tip. Nodular pattern.   Coronary artery disease    Diabetes mellitus    GERD (gastroesophageal reflux disease)    Hyperlipidemia    Hypertension    Renal insufficiency    creatinine 1.5 baseline    Sleep apnea    STOPBANG=5   Squamous cell carcinoma of skin 06/05/2015   Right superior helix. WD SCC.    Past Surgical History:  Procedure Laterality Date   CARDIOVASCULAR STRESS TEST  06-01-2010   normal nuclear stress test per dr Ubaldo Glassing. last office visit note dr Tommye Standard 02-01-2011 on chart   cataract extraction right eye  04-2011   colonscopy  5 yrs ago   EYE SURGERY  04-2010   macular hole surgery, right eye   HEMORRHOID SURGERY  yrs ago   TOTAL HIP ARTHROPLASTY  07/01/2011   Procedure: TOTAL HIP ARTHROPLASTY ANTERIOR APPROACH;  Surgeon: Mauri Pole, MD;  Location: [facility] ORS;  Service: Orthopedics;  Laterality: Left;    History reviewed. No pertinent family history.  Social History   Tobacco Use   Smoking status: Former    Packs/day: 2.00    Years: 41.00    Pack years: 82.00    Types:  Cigarettes    Quit date: 02/23/1995    Years since quitting: 25.5   Smokeless tobacco: Never  Substance Use Topics   Alcohol use: Yes    Comment:  wine 3-4 nights week   Drug use: No     Home Medications Prior to Admission medications   Medication Sig Start Date End Date Taking? Authorizing Provider  allopurinol (ZYLOPRIM) 100 MG tablet Take 100 mg by mouth daily. 07/25/17   [provider]  aspirin EC 81 MG tablet Take 81 mg by mouth daily.    [provider]  diphenhydrAMINE (BENADRYL) 25 mg capsule Take 1 capsule (25 mg total) by mouth every 6 (six) hours as needed for itching, allergies or sleep. 07/02/11 10/14/17  Danae Orleans, PA-C  fish oil-omega-3 fatty acids 1000 MG capsule Take 1 g by mouth 2 (two) times daily.     [provider]  glipiZIDE (GLUCOTROL) 5 MG tablet Take 2.5 mg by mouth every evening.     [provider]  hydrochlorothiazide (HYDRODIURIL) 25 MG tablet Take 25 mg by mouth daily with breakfast.     [provider]  metoprolol (LOPRESSOR) 100 MG tablet Take 100 mg by mouth daily with breakfast.    [provider]  naproxen (NAPROSYN)  500 MG tablet Take 1 tablet (500 mg total) by mouth 2 (two) times daily with a meal. 08/12/19   Lavonia Drafts, MD  omeprazole (PRILOSEC) 20 MG capsule Take 20 mg by mouth daily with breakfast.     [provider]  predniSONE (DELTASONE) 20 MG tablet Take 2 tablets (40 mg total) by mouth daily for 5 days. 09/20/20 09/25/20  Truddie Hidden, MD  Semaglutide,0.25 or 0.'5MG'$ /DOS, 2 MG/1.5ML SOPN Inject 0.5 mg into the skin every Sunday.    [provider]  simvastatin (ZOCOR) 80 MG tablet Take 40 mg by mouth at bedtime. Patient takes 1/2 tablet by mouth at bedtime ('40mg'$ )    [provider]  vitamin E 180 MG (400 UNITS) capsule Take 400 Units by mouth daily.    [provider]     Allergies    Lisinopril, Shellfish allergy, and Tetracyclines &  related   Review of Systems   Review of Systems A comprehensive review of systems was completed and negative except as noted in HPI.    Physical Exam BP (!) 175/98   Pulse 78   Temp 97.6 F (36.4 C) (Oral)   Resp (!) 22   SpO2 97%   Physical Exam Vitals and nursing note reviewed.  Constitutional:      Appearance: Normal appearance.  HENT:     Head: Normocephalic and atraumatic.     Nose: Nose normal.     Mouth/Throat:     Mouth: Mucous membranes are moist.     Comments: Marked angioedema of the tongue and sublingual areas Eyes:     Extraocular Movements: Extraocular movements intact.     Conjunctiva/sclera: Conjunctivae normal.  Cardiovascular:     Rate and Rhythm: Normal rate.  Pulmonary:     Effort: Pulmonary effort is normal.     Breath sounds: Normal breath sounds. No stridor. No wheezing.  Abdominal:     General: Abdomen is flat.     Palpations: Abdomen is soft.     Tenderness: There is no abdominal tenderness.  Musculoskeletal:        General: No swelling. Normal range of motion.     Cervical back: Neck supple.  Skin:    General: Skin is warm and dry.  Neurological:     General: No focal deficit present.     Mental Status: He is alert.  Psychiatric:        Mood and Affect: Mood normal.     ED Results / Procedures / Treatments   Labs (all labs ordered are listed, but only abnormal results are displayed) Labs Reviewed - No data to display  EKG None  Radiology No results found.  Procedures Procedures  Medications Ordered in the ED Medications  methylPREDNISolone sodium succinate (SOLU-MEDROL) 125 mg/2 mL injection 125 mg (125 mg Intravenous Given 09/20/20 1835)  famotidine (PEPCID) IVPB 20 mg premix (0 mg Intravenous Stopped 09/20/20 2242)     MDM Rules/Calculators/A&P MDM Patient with angioedema, likely from lisinopril. Already has been given epi and benadryl, will add steroids and pepcid. Continue to monitor in the ED.   ED Course  I  have reviewed the triage vital signs and the nursing notes.  Pertinent labs & imaging results that were available during my care of the patient were reviewed by me and considered in my medical decision making (see chart for details).  Clinical Course as of 09/20/20 2248  Sat Sep 20, 2020  2153 Tongue swelling has nearly resolved. He is tolerating PO  fluids well. Will continue to observe until 4hrs post Epi.  [CS]  2237 Patient continues to do well. He was advised to stop his lisinopril, discuss further outpatient BP management with PCP. Rx for prednisone, cautioned it may affect his glucose and BP and to keep an eye on it. He does not need an epi-pen refill today.  [CS]    Clinical Course User Index [CS] Truddie Hidden, MD    Final Clinical Impression(s) / ED Diagnoses Final diagnoses:  Angioedema, initial encounter    Rx / DC Orders ED Discharge Orders          Ordered    predniSONE (DELTASONE) 20 MG tablet  Daily,   Status:  Discontinued       Note to Pharmacy: Take 6 pills x 2 days, 5 pills x 2 days, 4 pills x 2 days, 3 pills x 2 days, 2 pills x 2 days, and 1 pill x 2 days   09/20/20 2242    predniSONE (DELTASONE) 20 MG tablet  Daily       Note to Pharmacy: Take 6 pills x 2 days, 5 pills x 2 days, 4 pills x 2 days, 3 pills x 2 days, 2 pills x 2 days, and 1 pill x 2 days   09/20/20 2242             Truddie Hidden, MD 09/20/20 2248

## 2020-11-17 ENCOUNTER — Ambulatory Visit (INDEPENDENT_AMBULATORY_CARE_PROVIDER_SITE_OTHER): Payer: Medicare Other | Admitting: Dermatology

## 2020-11-17 ENCOUNTER — Other Ambulatory Visit: Payer: Self-pay

## 2020-11-17 DIAGNOSIS — Z1283 Encounter for screening for malignant neoplasm of skin: Secondary | ICD-10-CM | POA: Diagnosis not present

## 2020-11-17 DIAGNOSIS — L918 Other hypertrophic disorders of the skin: Secondary | ICD-10-CM

## 2020-11-17 DIAGNOSIS — L570 Actinic keratosis: Secondary | ICD-10-CM

## 2020-11-17 DIAGNOSIS — D229 Melanocytic nevi, unspecified: Secondary | ICD-10-CM

## 2020-11-17 DIAGNOSIS — B353 Tinea pedis: Secondary | ICD-10-CM | POA: Diagnosis not present

## 2020-11-17 DIAGNOSIS — L821 Other seborrheic keratosis: Secondary | ICD-10-CM

## 2020-11-17 DIAGNOSIS — Z85828 Personal history of other malignant neoplasm of skin: Secondary | ICD-10-CM

## 2020-11-17 DIAGNOSIS — L578 Other skin changes due to chronic exposure to nonionizing radiation: Secondary | ICD-10-CM

## 2020-11-17 DIAGNOSIS — D1800 Hemangioma unspecified site: Secondary | ICD-10-CM

## 2020-11-17 DIAGNOSIS — L814 Other melanin hyperpigmentation: Secondary | ICD-10-CM

## 2020-11-17 MED ORDER — KETOCONAZOLE 2 % EX CREA: 1.0000 "application " | TOPICAL_CREAM | Freq: Every evening | CUTANEOUS | 11 refills | Status: AC

## 2020-11-17 NOTE — Progress Notes (Signed)
Follow-Up Visit   Subjective  William Harrington is a 80 y.o. male who presents for the following: Annual Exam (Patient here for full body skin exam and skin cancer screening. Patient with hx of SCC, BCC. Patient is not aware of any new or changing spots. ). The patient presents for Total-Body Skin Exam (TBSE) for skin cancer screening and mole check.  The following portions of the chart were reviewed this encounter and updated as appropriate:   Tobacco  Allergies  Meds  Problems  Med Hx  Surg Hx  Fam Hx     Review of Systems:  No other skin or systemic complaints except as noted in HPI or Assessment and Plan.  Objective  Well appearing patient in no apparent distress; mood and affect are within normal limits.  A full examination was performed including scalp, head, eyes, ears, nose, lips, neck, chest, axillae, abdomen, back, buttocks, bilateral upper extremities, bilateral lower extremities, hands, feet, fingers, toes, fingernails, and toenails. All findings within normal limits unless otherwise noted below.  right forehead x 1 Erythematous thin papules/macules with gritty scale.   bilateral feet Scaling and maceration web spaces and over distal and lateral soles.    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   ketoconazole (NIZORAL) 2 % cream - bilateral feet Apply 1  application topically at bedtime.  Skin cancer screening  History of Basal Cell Carcinoma of the Skin - No evidence of recurrence today - Recommend regular full body skin exams - Recommend daily broad spectrum sunscreen SPF 30+ to sun-exposed areas, reapply every 2 hours as needed.  - Call if any new or changing lesions are noted between office visits  History of Squamous Cell Carcinoma of the Skin - No evidence of recurrence today - No lymphadenopathy - Recommend regular full body skin exams - Recommend daily broad spectrum sunscreen SPF 30+ to sun-exposed areas, reapply every 2 hours as needed.  - Call if any new or changing lesions are noted between office visits  Acrochordons (Skin Tags) - Fleshy, skin-colored pedunculated papules - Benign appearing.  - Observe. - If desired, they can be removed with an in office procedure that is not covered by insurance. - Please call the clinic if you notice any new or changing lesions.  Lentigines - Scattered tan macules - Due to sun exposure - Benign-appearing, observe - Recommend daily broad spectrum sunscreen SPF 30+ to sun-exposed areas, reapply every 2 hours as needed. - Call for any changes  Seborrheic Keratoses - Stuck-on, waxy, tan-brown papules and/or plaques  - Benign-appearing - Discussed benign etiology and prognosis. - Observe - Call for any changes  Melanocytic Nevi - Tan-brown and/or pink-flesh-colored symmetric macules and papules - Benign appearing on exam today - Observation - Call clinic for new or changing moles - Recommend daily use of broad spectrum spf 30+ sunscreen to sun-exposed areas.   Hemangiomas - Red  papules - Discussed benign nature - Observe - Call for any changes  Actinic Damage - Chronic condition, secondary to cumulative UV/sun exposure - diffuse scaly erythematous macules with underlying dyspigmentation - Recommend daily broad spectrum sunscreen SPF 30+ to sun-exposed areas, reapply  every 2 hours as needed.  - Staying in the shade or wearing long sleeves, sun glasses (UVA+UVB protection) and wide brim hats (4-inch brim around the entire circumference of the hat) are also recommended for sun protection.  - Call for new or changing lesions.  Skin cancer screening performed today.  Return in about 1 year (around 11/17/2021) for TBSE.  Graciella Belton, RMA, am acting as scribe for Sarina Ser, MD . Documentation: I have reviewed the above documentation for accuracy and completeness, and I agree with the above.  Sarina Ser, MD

## 2020-11-17 NOTE — Patient Instructions (Signed)
Melanoma ABCDEs  Melanoma is the most dangerous type of skin cancer, and is the leading cause of death from skin disease.  You are more likely to develop melanoma if you: Have light-colored skin, light-colored eyes, or red or blond hair Spend a lot of time in the sun Tan regularly, either outdoors or in a tanning bed Have had blistering sunburns, especially during childhood Have a close family member who has had a melanoma Have atypical moles or large birthmarks  Early detection of melanoma is key since treatment is typically straightforward and cure rates are extremely high if we catch it early.   The first sign of melanoma is often a change in a mole or a new dark spot.  The ABCDE system is a way of remembering the signs of melanoma.  A for asymmetry:  The two halves do not match. B for border:  The edges of the growth are irregular. C for color:  A mixture of colors are present instead of an even brown color. D for diameter:  Melanomas are usually (but not always) greater than 20mm - the size of a pencil eraser. E for evolution:  The spot keeps changing in size, shape, and color.  Please check your skin once per month between visits. You can use a small mirror in front and a large mirror behind you to keep an eye on the back side or your body.   If you see any new or changing lesions before your next follow-up, please call to schedule a visit.  Please continue daily skin protection including broad spectrum sunscreen SPF 30+ to sun-exposed areas, reapplying every 2 hours as needed when you're outdoors.    Cryotherapy Aftercare  Wash gently with soap and water everyday.   Apply Vaseline and Band-Aid daily until healed.   If you have any questions or concerns for your doctor, please call our main line at [phone] and press option 4 to reach your doctor's medical assistant. If no one answers, please leave a voicemail as directed and we will return your call as soon as possible.  Messages left after 4 pm will be answered the following business day.   You may also send [place] a message via [place]. We typically respond to MyChart messages within 1-2 business days.  For prescription refills, please ask your pharmacy to contact our office. Our fax number is [phone].  If you have an urgent issue when the clinic is closed that cannot wait until the next business day, you can page your doctor at the number below.    Please note that while we do our best to be available for urgent issues outside of office hours, we are not available 24/7.   If you have an urgent issue and are unable to reach [place], you may choose to seek medical care at your doctor's office, retail clinic, urgent care center, or emergency room.  If you have a medical emergency, please immediately call 911 or go to the emergency department.  Pager Numbers  - Dr. Nehemiah Massed: [phone]  - Dr. Laurence Ferrari: [phone]  - Dr. Nicole Kindred: [phone]  In the event of inclement weather, please call our main line at [phone] for an update on the status of any delays or closures.  Dermatology Medication Tips: Please keep the boxes that topical medications come in in order to help keep track of the instructions about where and how to use these. Pharmacies typically print the medication instructions only on the boxes and not directly on  the medication tubes.   If your medication is too expensive, please contact our office at [phone] option 4 or send us a message through MyChart.   We are unable to tell what your co-pay for medications will be in advance as this is different depending on your insurance coverage. However, we may be able to find a substitute medication at lower cost or fill out paperwork to get insurance to cover a needed medication.   If a prior authorization is required to get your medication covered by your insurance company, please allow us 1-2 business days to complete this process.  Drug  prices often vary depending on where the prescription is filled and some pharmacies may offer cheaper prices.  The website www.goodrx.com contains coupons for medications through different pharmacies. The prices here do not account for what the cost may be with help from insurance (it may be cheaper with your insurance), but the website can give you the price if you did not use any insurance.  - You can print the associated coupon and take it with your prescription to the pharmacy.  - You may also stop by our office during regular business hours and pick up a GoodRx coupon card.  - If you need your prescription sent electronically to a different pharmacy, notify our office through [place] MyChart or by phone at [phone] option 4.  

## 2020-11-20 ENCOUNTER — Encounter: Payer: Self-pay | Admitting: Dermatology

## 2021-07-23 ENCOUNTER — Other Ambulatory Visit: Payer: Self-pay | Admitting: *Deleted

## 2021-11-18 ENCOUNTER — Ambulatory Visit: Payer: Medicare Other | Admitting: Dermatology

## 2022-07-22 ENCOUNTER — Other Ambulatory Visit: Payer: Self-pay | Admitting: Nephrology

## 2022-07-22 DIAGNOSIS — N281 Cyst of kidney, acquired: Secondary | ICD-10-CM

## 2022-07-22 DIAGNOSIS — N1832 Chronic kidney disease, stage 3b: Secondary | ICD-10-CM

## 2022-07-22 DIAGNOSIS — R8290 Unspecified abnormal findings in urine: Secondary | ICD-10-CM

## 2022-07-30 ENCOUNTER — Ambulatory Visit
Admission: RE | Admit: 2022-07-30 | Discharge: 2022-07-30 | Disposition: A | Discharge status: Home / Self Care | Payer: Medicare Other | Source: Ambulatory Visit | Attending: Nephrology | Admitting: Nephrology

## 2022-07-30 DIAGNOSIS — N281 Cyst of kidney, acquired: Secondary | ICD-10-CM | POA: Insufficient documentation

## 2022-07-30 DIAGNOSIS — R8290 Unspecified abnormal findings in urine: Secondary | ICD-10-CM | POA: Diagnosis present

## 2022-07-30 DIAGNOSIS — N1832 Chronic kidney disease, stage 3b: Secondary | ICD-10-CM | POA: Insufficient documentation

## 2023-04-20 ENCOUNTER — Encounter: Payer: Self-pay | Admitting: Dermatology

## 2023-04-20 ENCOUNTER — Ambulatory Visit (INDEPENDENT_AMBULATORY_CARE_PROVIDER_SITE_OTHER): Payer: Medicare Other | Admitting: Dermatology

## 2023-04-20 VITALS — BP 144/92

## 2023-04-20 DIAGNOSIS — W908XXA Exposure to other nonionizing radiation, initial encounter: Secondary | ICD-10-CM | POA: Diagnosis not present

## 2023-04-20 DIAGNOSIS — L814 Other melanin hyperpigmentation: Secondary | ICD-10-CM

## 2023-04-20 DIAGNOSIS — D492 Neoplasm of unspecified behavior of bone, soft tissue, and skin: Secondary | ICD-10-CM

## 2023-04-20 DIAGNOSIS — D1801 Hemangioma of skin and subcutaneous tissue: Secondary | ICD-10-CM | POA: Diagnosis not present

## 2023-04-20 DIAGNOSIS — L570 Actinic keratosis: Secondary | ICD-10-CM

## 2023-04-20 DIAGNOSIS — D485 Neoplasm of uncertain behavior of skin: Secondary | ICD-10-CM

## 2023-04-20 DIAGNOSIS — Z1283 Encounter for screening for malignant neoplasm of skin: Secondary | ICD-10-CM | POA: Diagnosis not present

## 2023-04-20 DIAGNOSIS — D229 Melanocytic nevi, unspecified: Secondary | ICD-10-CM

## 2023-04-20 DIAGNOSIS — L578 Other skin changes due to chronic exposure to nonionizing radiation: Secondary | ICD-10-CM

## 2023-04-20 DIAGNOSIS — L821 Other seborrheic keratosis: Secondary | ICD-10-CM

## 2023-04-20 DIAGNOSIS — Z85828 Personal history of other malignant neoplasm of skin: Secondary | ICD-10-CM

## 2023-04-20 NOTE — Progress Notes (Signed)
 New Patient Visit   Subjective  William Harrington is a 83 y.o. male who presents for the following: New Pt - Upper Body Exam  Patient states he  has a growth located at the R hand that he  would like to have examined. Patient reports the areas have been there for 4 months. He reports the areas are bothersome.Patient rates irritation (soreness)7 out of 10. He states that the areas have not spread. Patient reports he  has previously been treated for these areas by the [facility] and received a referral to specialist. Patient reports Hx of bx (BCC/SCC). Patient denied family history of skin cancer(s). Pt also has a spot on L arm that he would like looked at. He states it was removed 5years ago but it has came back.  The patient has spots, moles and lesions to be evaluated, some may be new or changing and the patient may have concern these could be cancer.   The following portions of the chart were reviewed this encounter and updated as appropriate: medications, allergies, medical history  Review of Systems:  No other skin or systemic complaints except as noted in HPI or Assessment and Plan.  Objective  Well appearing patient in no apparent distress; mood and affect are within normal limits.   A focused examination was performed of the following areas: uppder body   Relevant exam findings are noted in the Assessment and Plan.         Left Forearm - Anterior 2.5cm pink scaly plaque Left Temple, R ear, R cheek, R forearm (8) Erythematous thin papules/macules with gritty scale.   Assessment & Plan   LENTIGINES, SEBORRHEIC KERATOSES, HEMANGIOMAS - Benign normal skin lesions - Benign-appearing - Call for any changes  BENIGN MELANOCYTIC NEVI - Tan-brown and/or pink-flesh-colored symmetric macules and papules - Benign appearing on exam today - Observation - Call clinic for new or changing moles - Recommend daily use of broad spectrum spf 30+ sunscreen to sun-exposed areas.   MILD ACTINIC  DAMAGE - Chronic condition, secondary to cumulative UV/sun exposure - diffuse scaly erythematous macules with underlying dyspigmentation - Recommend daily broad spectrum sunscreen SPF 30+ to sun-exposed areas, reapply every 2 hours as needed.  - Staying in the shade or wearing long sleeves, sun glasses (UVA+UVB protection) and wide brim hats (4-inch brim around the entire circumference of the hat) are also recommended for sun protection.  - Call for new or changing lesions.   NEOPLASM OF UNCERTAIN BEHAVIOR OF SKIN Left Forearm - Anterior Skin / nail biopsy Type of biopsy: tangential   Informed consent: discussed and consent obtained   Timeout: patient name, date of birth, surgical site, and procedure verified   Procedure prep:  Patient was prepped and draped in usual sterile fashion Prep type:  Isopropyl alcohol Anesthesia: the lesion was anesthetized in a standard fashion   Anesthetic:  1% lidocaine w/ epinephrine 1-100,000 buffered w/ 8.4% NaHCO3 Instrument used: DermaBlade   Hemostasis achieved with: aluminum chloride   Outcome: patient tolerated procedure well   Post-procedure details: sterile dressing applied and wound care instructions given   Dressing type: petrolatum gauze and bandage   Specimen 1 - Surgical pathology Differential Diagnosis: r/o BCC  Check Margins: yes AK (ACTINIC KERATOSIS) (8) Left Temple, R ear, R cheek, R forearm (8) Destruction of lesion - Left Temple, R ear, R cheek, R forearm (8) Complexity: simple   Destruction method: cryotherapy   Informed consent: discussed and consent obtained   Timeout:  patient name, date of birth, surgical site, and procedure verified Lesion destroyed using liquid nitrogen: Yes   Region frozen until ice ball extended beyond lesion: Yes   Outcome: patient tolerated procedure well with no complications   Post-procedure details: wound care instructions given   SKIN EXAM FOR MALIGNANT NEOPLASM   MULTIPLE BENIGN MELANOCYTIC  NEVI   CHERRY ANGIOMA   LENTIGINES   ACTINIC SKIN DAMAGE    Return in about 6 months (around 10/18/2023) for upper body exam.    Documentation: I have reviewed the above documentation for accuracy and completeness, and I agree with the above.   I, Shirron Marcha Solders, CMA, am acting as scribe for [employer], DO.   Langston Reusing, DO

## 2023-04-20 NOTE — Patient Instructions (Addendum)

## 2023-04-22 LAB — SURGICAL PATHOLOGY

## 2023-04-25 NOTE — Progress Notes (Signed)
 Bx results confirmed dx of actinic keratosis.  We will recheck the area at pt's 6 month follow up to see if any residual AK remains and treat with LN2 if necessary.

## 2023-10-25 ENCOUNTER — Ambulatory Visit: Payer: Medicare Other | Admitting: Dermatology
